# Patient Record
Sex: Female | Born: 1970
Health system: Southern US, Community
[De-identification: ages and names within clinical notes are randomized; demographics above are authoritative.]

## PROBLEM LIST (undated history)

## (undated) DIAGNOSIS — R918 Other nonspecific abnormal finding of lung field: Secondary | ICD-10-CM

## (undated) DIAGNOSIS — F329 Major depressive disorder, single episode, unspecified: Secondary | ICD-10-CM

## (undated) DIAGNOSIS — G2581 Restless legs syndrome: Secondary | ICD-10-CM

## (undated) DIAGNOSIS — T7840XA Allergy, unspecified, initial encounter: Secondary | ICD-10-CM

## (undated) DIAGNOSIS — N939 Abnormal uterine and vaginal bleeding, unspecified: Secondary | ICD-10-CM

## (undated) DIAGNOSIS — M1990 Unspecified osteoarthritis, unspecified site: Secondary | ICD-10-CM

## (undated) DIAGNOSIS — F909 Attention-deficit hyperactivity disorder, unspecified type: Secondary | ICD-10-CM

## (undated) DIAGNOSIS — Z9289 Personal history of other medical treatment: Secondary | ICD-10-CM

## (undated) DIAGNOSIS — I341 Nonrheumatic mitral (valve) prolapse: Secondary | ICD-10-CM

## (undated) DIAGNOSIS — G40209 Localization-related (focal) (partial) symptomatic epilepsy and epileptic syndromes with complex partial seizures, not intractable, without status epilepticus: Secondary | ICD-10-CM

## (undated) DIAGNOSIS — F419 Anxiety disorder, unspecified: Secondary | ICD-10-CM

## (undated) DIAGNOSIS — R002 Palpitations: Secondary | ICD-10-CM

## (undated) HISTORY — DX: Restless legs syndrome: G25.81

## (undated) HISTORY — DX: Anxiety disorder, unspecified: F41.9

## (undated) HISTORY — PX: NASAL SEPTUM SURGERY: SHX37

## (undated) HISTORY — DX: Attention-deficit hyperactivity disorder, unspecified type: F90.9

## (undated) HISTORY — DX: Other nonspecific abnormal finding of lung field: R91.8

## (undated) HISTORY — PX: TRANSTHORACIC ECHOCARDIOGRAM: SHX275

## (undated) HISTORY — PX: BREAST SURGERY: SHX581

## (undated) HISTORY — PX: PARTIAL MASTECTOMY WITH NEEDLE LOCALIZATION: SHX6008

## (undated) HISTORY — DX: Unspecified osteoarthritis, unspecified site: M19.90

## (undated) HISTORY — DX: Allergy, unspecified, initial encounter: T78.40XA

## (undated) HISTORY — DX: Nonrheumatic mitral (valve) prolapse: I34.1

---

## 2007-03-18 ENCOUNTER — Encounter (INDEPENDENT_AMBULATORY_CARE_PROVIDER_SITE_OTHER): Payer: Self-pay | Admitting: Diagnostic Radiology

## 2007-03-18 ENCOUNTER — Encounter: Admission: RE | Admit: 2007-03-18 | Discharge: 2007-03-18 | Payer: Self-pay | Admitting: Internal Medicine

## 2007-03-18 ENCOUNTER — Other Ambulatory Visit: Admission: RE | Admit: 2007-03-18 | Discharge: 2007-03-18 | Payer: Self-pay | Admitting: Diagnostic Radiology

## 2007-06-19 ENCOUNTER — Encounter: Admission: RE | Admit: 2007-06-19 | Discharge: 2007-06-19 | Payer: Self-pay | Admitting: Internal Medicine

## 2007-06-21 ENCOUNTER — Encounter (INDEPENDENT_AMBULATORY_CARE_PROVIDER_SITE_OTHER): Payer: Self-pay | Admitting: Diagnostic Radiology

## 2007-06-21 ENCOUNTER — Encounter: Admission: RE | Admit: 2007-06-21 | Discharge: 2007-06-21 | Payer: Self-pay | Admitting: Internal Medicine

## 2007-07-23 ENCOUNTER — Encounter: Admission: RE | Admit: 2007-07-23 | Discharge: 2007-07-23 | Payer: Self-pay | Admitting: General Surgery

## 2007-07-23 ENCOUNTER — Ambulatory Visit (HOSPITAL_BASED_OUTPATIENT_CLINIC_OR_DEPARTMENT_OTHER): Admission: RE | Admit: 2007-07-23 | Discharge: 2007-07-23 | Payer: Self-pay | Admitting: General Surgery

## 2007-07-23 ENCOUNTER — Encounter (INDEPENDENT_AMBULATORY_CARE_PROVIDER_SITE_OTHER): Payer: Self-pay | Admitting: General Surgery

## 2008-05-13 ENCOUNTER — Ambulatory Visit (HOSPITAL_COMMUNITY): Admission: RE | Admit: 2008-05-13 | Discharge: 2008-05-13 | Payer: Self-pay | Admitting: Internal Medicine

## 2008-10-05 ENCOUNTER — Other Ambulatory Visit: Admission: RE | Admit: 2008-10-05 | Discharge: 2008-10-05 | Payer: Self-pay | Admitting: Obstetrics and Gynecology

## 2008-11-12 ENCOUNTER — Encounter: Admission: RE | Admit: 2008-11-12 | Discharge: 2008-11-12 | Payer: Self-pay | Admitting: Family Medicine

## 2009-04-26 ENCOUNTER — Ambulatory Visit: Payer: Self-pay | Admitting: Psychology

## 2010-06-19 ENCOUNTER — Encounter: Payer: Self-pay | Admitting: Internal Medicine

## 2010-07-25 ENCOUNTER — Other Ambulatory Visit: Payer: Self-pay | Admitting: Obstetrics and Gynecology

## 2010-07-25 ENCOUNTER — Other Ambulatory Visit (HOSPITAL_COMMUNITY)
Admission: RE | Admit: 2010-07-25 | Discharge: 2010-07-25 | Disposition: A | Discharge status: Home / Self Care | Payer: BC Managed Care – HMO | Source: Ambulatory Visit | Attending: Obstetrics and Gynecology | Admitting: Obstetrics and Gynecology

## 2010-07-25 DIAGNOSIS — Z01419 Encounter for gynecological examination (general) (routine) without abnormal findings: Secondary | ICD-10-CM | POA: Insufficient documentation

## 2010-07-25 DIAGNOSIS — Z113 Encounter for screening for infections with a predominantly sexual mode of transmission: Secondary | ICD-10-CM | POA: Insufficient documentation

## 2010-10-11 NOTE — Op Note (Signed)
[name], Krista Meyer          ACCOUNT NO.:  [id number]   MEDICAL RECORD NO.:  [id number]          PATIENT TYPE:  AMB   LOCATION:  DSC                          FACILITY:  [facility]   PHYSICIAN:  Adolph Pollack, M.D.DATE OF BIRTH:  1970-11-23   DATE OF PROCEDURE:  07/23/2007  DATE OF DISCHARGE:                               OPERATIVE REPORT   PREOPERATIVE DIAGNOSIS:  Abnormal right mammogram with abnormal right  breast lesions.   POSTOPERATIVE DIAGNOSIS:  Abnormal right mammogram with abnormal right  breast lesions.   PROCEDURE:  Right partial mastectomy after needle localizations.   SURGEON:  Adolph Pollack, M.D.   ANESTHESIA:  General plus Marcaine local.   INDICATIONS:  This 40 year old female had abnormal right breast  mammogram and underwent a biopsy of a 12 o'clock and a 2 o'clock lesion.  They both had some stromal fibrosis and microcalcifications; however,  there is some discordance between the mammographic findings and  pathology and now she presents for needle localizations and biopsies of  the area.   TECHNIQUE:  She underwent successful needle localization by Dr. Anselmo Pickler at the breast center.  She was then brought to [facility] day surgery,  right breast marked with my initials.  She is then placed supine on the  operating table and general anesthetic was administered.  The bandages  from the right breast removed and the wires were cut.  The right breast  and wires were sterilely prepped and draped.  From about the 11 o'clock  and 2 o'clock position, a curvilinear incision was made in the right  breast in the skin and subcutaneous tissue.  I elevated the superior  flap and pulled the wires through the flap.  I then basically removed  most of the tissue of the right upper outer quadrant beginning  3-4 cm  away from the nipple areolar complex.  Once I had done this then I  placed it in the Faxitron.  She had clips in from previous biopsy sites  and this  showed the clips were centralized.  I sent this over to Dr.  Jean Rosenthal, who concurred that we had the area of concern.  The specimen  was marked with one suture medial, two sutures superior and then sent to  the pathology lab.   Following this I controlled bleeding with electrocautery.  Once  hemostasis was adequate, I anesthetized the superficial and deep  portions of the wound with Marcaine solution.  I then closed the  subcutaneous tissue with interrupted 3-0 Vicryl sutures.  The skin  incision was closed with a 4-0 Monocryl subcuticular stitch.  Steri-  Strips and sterile dressing were applied.   She tolerated the procedure well without apparent complications and was  taken to recovery room in satisfactory condition.  She will be given  discharge instructions and Vicodin for pain and follow up in the office  in one to two weeks.      Adolph Pollack, M.D.  Electronically Signed     TJR/MEDQ  D:  07/23/2007  T:  07/24/2007  Job:  [id number]   cc:   Feliciana Rossetti, MD  Nolon Nations, M.D.

## 2011-02-17 LAB — DIFFERENTIAL
Basophils Absolute: 0
Lymphocytes Relative: 33
Lymphs Abs: 2
Monocytes Absolute: 0.5
Monocytes Relative: 8
Neutro Abs: 3.4

## 2011-02-17 LAB — BASIC METABOLIC PANEL
CO2: 28
Calcium: 8.8
Chloride: 100
Creatinine, Ser: 0.71
GFR calc non Af Amer: >60
Potassium: 3.7

## 2011-02-17 LAB — CBC: WBC: 6.1

## 2011-02-17 LAB — POCT HEMOGLOBIN-HEMACUE: Hemoglobin: 13.8

## 2012-12-24 ENCOUNTER — Encounter: Payer: Self-pay | Admitting: Neurology

## 2012-12-24 ENCOUNTER — Ambulatory Visit (INDEPENDENT_AMBULATORY_CARE_PROVIDER_SITE_OTHER): Payer: 59 | Admitting: Neurology

## 2012-12-24 VITALS — BP 90/64 | HR 78 | Temp 98.0°F | Ht 63.0 in | Wt 140.0 lb

## 2012-12-24 DIAGNOSIS — G2581 Restless legs syndrome: Secondary | ICD-10-CM

## 2012-12-24 DIAGNOSIS — G40109 Localization-related (focal) (partial) symptomatic epilepsy and epileptic syndromes with simple partial seizures, not intractable, without status epilepticus: Secondary | ICD-10-CM

## 2012-12-24 DIAGNOSIS — R569 Unspecified convulsions: Secondary | ICD-10-CM

## 2012-12-24 LAB — FERRITIN: Ferritin: 86.6 ng/mL (ref 10.0–291.0)

## 2012-12-24 LAB — VITAMIN B12: Vitamin B-12: 161 pg/mL — ABNORMAL LOW (ref 211–911)

## 2012-12-24 MED ORDER — LEVETIRACETAM ER 500 MG PO TB24: 500.0000 mg | ORAL_TABLET | Freq: Two times a day (BID) | ORAL | Status: DC

## 2012-12-24 MED ORDER — ROPINIROLE HCL 0.5 MG PO TABS: ORAL_TABLET | ORAL | Status: DC

## 2012-12-24 NOTE — Patient Instructions (Addendum)
1.  Continue Keppra XR 500mg  twice daily. 2.  We will start ropinirole 0.5mg  tablets.  Take 1/2 tab at bedtime x 2 days,   Then 1 tab at bedtime x 5 days,  Then 2 tabs at bedtime.  Take about 1 hour before bed.  Side effects include sleepiness, dizziness, vivid dreams, compulsive behavior. 3.  Check ferritin level. 4.  Follow up in 6 months.  Call with questions or concerns.

## 2012-12-24 NOTE — Progress Notes (Signed)
NEUROLOGY CONSULTATION NOTE  [name] MRN: [id number] DOB: 1970-06-14   Referring provider: Dr. Adella Hare Primary care provider: Dr. Barbaraann Barthel  Reason for consult:  Establish care  HISTORY OF PRESENT ILLNESS: Krista Meyer is a 42 y.o. female with ADHD and seizure disorder who presents to establish care regarding history of seizure disorder.  Outside records and reports reviewed.  She began experiencing symptoms in highschool, described as a feeling of "dj vu" and a "weird "feeling". It was really brief and she was told that her face turns red. There was no altered consciousness or alertness. At that time, she had a cardiac workup which was negative. Her symptoms were thought to be psychosomatic. A couple of years later, while at college, she had one of her dj vu feelings followed by a generalized tonic-clonic seizure. This was preceded by a long night awake and feeling exhausted.  She was initially started on Dilantin, but she became toxic. She was subsequently switched to Tegretol. It is been effective, but it gave her a headache. About 3 or 4 years ago, she was switched to Keppra and has been tolerating it very well. She has not had any further generalized tonic-clonic seizures or typical dj vu spells since college. Occasionally, she has a very brief indescribable dj vu feeling when she is in bed while falling asleep.  More recently, she has been experiencing restless leg symptoms.  She describes a "creepy crawly" feeling associated with cramping involving both the arms and the legs.  Moving or applying warm pressure relieves the symptoms.  It only bothers her in the evening, while in bed or just prior to going to bed.  She does have a history of arthritic back pain.  MRI of lumbar spine performed 08/31/03 revealed degenerative disk disease at L4-L5, with prominent disk bulge and posterior annular tear, but no disk extrusion or stenosis.  She had a sleep study performed  performed on 10/03/11 to assess for sleep apnea.  Although she did not have apnea, the technician told her she would move her legs about every 3 minutes.  She has had routine EEGs which were both normal and sometimes abnormal.  She was told abnormal EEGs revealed discharges in the right temporal lobe.  MRI of brain without contrast, performed on 01/16/08, was unremarkable.    No family history of seizures.  PAST MEDICAL HISTORY: Past Medical History  Diagnosis Date  . Seizure disorder     dx at age 73    PAST SURGICAL HISTORY: Past Surgical History  Procedure Laterality Date  . Nasal septum surgery      MEDICATIONS: No current outpatient prescriptions on file prior to visit.   No current facility-administered medications on file prior to visit.    ALLERGIES: Allergies  Allergen Reactions  . Iodine     "Burns my skin"  . Shellfish Allergy     FAMILY HISTORY: No family history on file.  SOCIAL HISTORY: History   Social History  . Marital Status: Married    Spouse Name: N/A    Number of Children: N/A  . Years of Education: N/A   Occupational History  . Not on file.   Social History Main Topics  . Smoking status: Never Smoker   . Smokeless tobacco: Never Used  . Alcohol Use: No  . Drug Use: No  . Sexually Active: Not on file   Other Topics Concern  . Not on file   Social History Narrative  . No narrative on file  REVIEW OF SYSTEMS: Constitutional: No fevers, chills, or sweats, no generalized fatigue, change in appetite Eyes: No visual changes, double vision, eye pain Ear, nose and throat: No hearing loss, ear pain, nasal congestion, sore throat Cardiovascular: No chest pain, palpitations Respiratory:  No shortness of breath at rest or with exertion, wheezes GastrointestinaI: No nausea, vomiting, diarrhea, abdominal pain, fecal incontinence Genitourinary:  No dysuria, urinary retention or frequency Musculoskeletal:  No neck pain, back  pain Integumentary: No rash, pruritus, skin lesions Neurological: as above Psychiatric: No depression, insomnia, anxiety Endocrine: No palpitations, fatigue, diaphoresis, mood swings, change in appetite, change in weight, increased thirst Hematologic/Lymphatic:  No anemia, purpura, petechiae. Allergic/Immunologic: no itchy/runny eyes, nasal congestion, recent allergic reactions, rashes  PHYSICAL EXAM: Filed Vitals:   12/24/12 0829  BP: 90/64  Pulse: 78  Temp: 98 F (36.7 C)   General: No acute distress Head:  Normocephalic/atraumatic Neck: supple, no paraspinal tenderness, full range of motion Back: No paraspinal tenderness Heart: regular rate and rhythm Lungs: Clear to auscultation bilaterally. Vascular: No carotid bruits. Neurological Exam: Mental status: alert and oriented to person, place, time and self, speech fluent and not dysarthric, language intact. Cranial nerves: CN I: not tested CN II: pupils equal, round and reactive to light, visual fields intact, fundi unremarkable. CN III, IV, VI:  full range of motion, no nystagmus, no ptosis CN V: facial sensation intact CN VII: upper and lower face symmetric CN VIII: hearing intact CN IX, X: gag intact, uvula midline CN XI: sternocleidomastoid and trapezius muscles intact CN XII: tongue midline Bulk & Tone: normal, no fasciculations. Motor: 5/5 throughout Sensation: temperature and vibration intact Deep Tendon Reflexes: 2+ throughout, toes down Finger to nose testing: normal Heel to shin: normal Gait: normal, able to walk in tandem. Romberg negative.  IMPRESSION & PLAN: Krista Meyer is a 42 y.o. female with: 1.  Localization-related epilepsy, well-controlled.  - Continue Keppra XR 500mg  BID  - No driving restrictions warranted.  - She asked if Keppra was a good choice in case she wishes to get pregnant.  I told her that Keppra is one of the ideal antiepileptic medications to take while pregnant, as it has a  low teratogenic profile.  Defects, if any, are very mild, such as cleft lip.  2.  Restless leg syndrome.  - Start ropinirole 0.25mg  and titrate up to 1mg  qhs over a week.  Take about 1 hour prior to bed.  Side effects, such as somnolence and compulsive behavior discussed.  - Check ferritin and B12 levels  3.  Follow up in 6 months or as needed.  Call with questions or concerns.  Thank you for allowing me to take part in the care of this patient.  Shon Millet, DO  CC:  Clayborn Heron, MD  Veverly Fells. Adella Hare, MD

## 2013-02-04 ENCOUNTER — Other Ambulatory Visit: Payer: Self-pay | Admitting: Obstetrics and Gynecology

## 2013-02-04 ENCOUNTER — Other Ambulatory Visit (HOSPITAL_COMMUNITY)
Admission: RE | Admit: 2013-02-04 | Discharge: 2013-02-04 | Disposition: A | Discharge status: Home / Self Care | Payer: 59 | Source: Ambulatory Visit | Attending: Obstetrics and Gynecology | Admitting: Obstetrics and Gynecology

## 2013-02-04 DIAGNOSIS — Z01419 Encounter for gynecological examination (general) (routine) without abnormal findings: Secondary | ICD-10-CM | POA: Insufficient documentation

## 2013-02-04 DIAGNOSIS — Z1151 Encounter for screening for human papillomavirus (HPV): Secondary | ICD-10-CM | POA: Insufficient documentation

## 2013-02-21 ENCOUNTER — Other Ambulatory Visit: Payer: Self-pay | Admitting: Neurology

## 2013-02-21 MED ORDER — LEVETIRACETAM ER 500 MG PO TB24: 500.0000 mg | ORAL_TABLET | Freq: Two times a day (BID) | ORAL | Status: DC

## 2013-02-24 ENCOUNTER — Other Ambulatory Visit: Payer: Self-pay | Admitting: Neurology

## 2013-02-24 MED ORDER — ROPINIROLE HCL 0.5 MG PO TABS: ORAL_TABLET | ORAL | Status: DC

## 2013-02-28 ENCOUNTER — Telehealth: Payer: Self-pay | Admitting: Neurology

## 2013-02-28 NOTE — Telephone Encounter (Signed)
Left the patient a message as per Dr. Everlena Cooper below. Asked that she call the office on Monday morning if she would like to try a different medication. Will wait to hear from her.

## 2013-02-28 NOTE — Telephone Encounter (Signed)
Patient left a vm message on my machine at 0906 am stating the Ropinirole that Dr. Everlena Cooper stated her on was just not working and the side effects were worse than the restless legs. She wants to know how to stop the medication...wean down or stop cold [place]. She is asking for a call back with instructions on how to stop. **Dr. Everlena Cooper please advise.

## 2013-02-28 NOTE — Telephone Encounter (Signed)
I assume she is taking 1mg  at bedtime.  She can take 0.5mg  at bedtime for 3 days and then stop.  Instead, we can try an alternative medication, such as Mirapex 0.125mg  at bedtime.  It is the same class of drug with similar side effects, but maybe she would tolerate it better.

## 2013-03-06 NOTE — Telephone Encounter (Signed)
No return call from the patient 

## 2013-05-16 ENCOUNTER — Other Ambulatory Visit: Payer: Self-pay | Admitting: Neurology

## 2013-05-16 MED ORDER — LEVETIRACETAM ER 500 MG PO TB24: 500.0000 mg | ORAL_TABLET | Freq: Two times a day (BID) | ORAL | Status: DC

## 2013-06-16 ENCOUNTER — Telehealth: Payer: Self-pay | Admitting: Neurology

## 2013-06-16 MED ORDER — LEVETIRACETAM ER 500 MG PO TB24: 500.0000 mg | ORAL_TABLET | Freq: Two times a day (BID) | ORAL | Status: DC

## 2013-06-16 NOTE — Telephone Encounter (Signed)
Levetiracetam ER 500 mg #60 refill request received from [facility]. Approved with 0 refills. Note sent to pharmacy for patient to make follow up appt for additional refills.

## 2013-07-16 ENCOUNTER — Telehealth: Payer: Self-pay | Admitting: *Deleted

## 2013-07-16 NOTE — Telephone Encounter (Signed)
Pt has scheduled a follow up scheduled for 07/28/13. She will need med refill (see prev. Documentation) to last her until that appt. Please call patient / Krista Meyer.

## 2013-07-16 NOTE — Telephone Encounter (Signed)
Patient is aware that an appt is needed before meds can be refilled

## 2013-07-23 ENCOUNTER — Telehealth: Payer: Self-pay | Admitting: Neurology

## 2013-07-23 NOTE — Telephone Encounter (Signed)
Per patient she is low on her medication and does not have enough until Monday when she comes in to see D r Tomi Likens please call her at [phone]  Medication keppra

## 2013-07-28 ENCOUNTER — Encounter: Payer: Self-pay | Admitting: Neurology

## 2013-07-28 ENCOUNTER — Ambulatory Visit (INDEPENDENT_AMBULATORY_CARE_PROVIDER_SITE_OTHER): Payer: 59 | Admitting: Neurology

## 2013-07-28 VITALS — BP 118/60 | HR 68 | Temp 97.6°F | Resp 18 | Ht 63.0 in | Wt 138.2 lb

## 2013-07-28 DIAGNOSIS — G2581 Restless legs syndrome: Secondary | ICD-10-CM

## 2013-07-28 DIAGNOSIS — G40109 Localization-related (focal) (partial) symptomatic epilepsy and epileptic syndromes with simple partial seizures, not intractable, without status epilepticus: Secondary | ICD-10-CM

## 2013-07-28 MED ORDER — LEVETIRACETAM ER 500 MG PO TB24: 500.0000 mg | ORAL_TABLET | Freq: Two times a day (BID) | ORAL | Status: DC

## 2013-07-28 NOTE — Progress Notes (Signed)
NEUROLOGY FOLLOW UP OFFICE NOTE  [name] [id number]  HISTORY OF PRESENT ILLNESS: Krista Meyer is a 43 y.o. female with ADHD and seizure disorder who follows up for localization-related seizure disorder and restless leg syndrome.  Records and reports reviewed.  Records and images were personally reviewed where available.    UPDATE: She has had no seizures.  She feels a little moody, but not sure if it is due to the [place] or just how she feels (she has a history of irritability and moodiness).  She was started on Prozac last fall, and that helps.  She was taking turmeric for her skin.  When she stopped taking it, her restless leg resolved.  She is no longer on a dopamine agonist.  HISTORY: Seizures:  She began experiencing symptoms in highschool, described as a feeling of "dj vu" and a "weird "feeling". It was really brief and she was told that her face turns red. There was no altered consciousness or alertness. At that time, she had a cardiac workup which was negative. Her symptoms were thought to be psychosomatic. A couple of years later, while at college, she had one of her dj vu feelings followed by a generalized tonic-clonic seizure. This was preceded by a long night awake and feeling exhausted.  She was initially started on Dilantin, but she became toxic. She was subsequently switched to Tegretol. It is been effective, but it gave her a headache. About 3 or 4 years ago, she was switched to [place] and has been tolerating it very well. She has not had any further generalized tonic-clonic seizures or typical dj vu spells since college. Occasionally, she has a very brief indescribable dj vu feeling when she is in bed while falling asleep.  She has had routine EEGs which were both normal and sometimes abnormal.  She was told abnormal EEGs revealed discharges in the right temporal lobe.  MRI of brain without contrast, performed on 01/16/08, was unremarkable.  No family  history of seizures.  She takes Keppra XR 500mg  twice daily.  Prior medications include Dilantin (toxic), Tegretol (headache), phenobarb (side effect), Lamictal (not remember why it was stopped).  Restless leg syndrome:  She describes a "creepy crawly" feeling associated with cramping involving both the arms and the legs.  Moving or applying warm pressure relieves the symptoms.  It only bothers her in the evening, while in bed or just prior to going to bed.  She does have a history of arthritic back pain.  MRI of lumbar spine performed 08/31/03 revealed degenerative disk disease at L4-L5, with prominent disk bulge and posterior annular tear, but no disk extrusion or stenosis.  She had a sleep study performed performed on 10/03/11 to assess for sleep apnea.  Although she did not have apnea, the technician told her she would move her legs about every 3 minutes.  Ropinirole caused unwanted side effects.  PAST MEDICAL HISTORY: Past Medical History  Diagnosis Date  . Seizure disorder     dx at age 50    MEDICATIONS: Current Outpatient Prescriptions on File Prior to Visit  Medication Sig Dispense Refill  . acetaminophen (TYLENOL) 325 MG tablet Take 325 mg by mouth every 6 (six) hours as needed for pain (prn).       No current facility-administered medications on file prior to visit.    ALLERGIES: Allergies  Allergen Reactions  . Iodine     "Burns my skin"  . Shellfish Allergy     FAMILY HISTORY:  Family History  Problem Relation Age of Onset  . Diabetes insipidus Paternal Uncle   . Stroke Paternal Grandfather   . Diabetes insipidus Maternal Aunt     SOCIAL HISTORY: History   Social History  . Marital Status: Married    Spouse Name: N/A    Number of Children: N/A  . Years of Education: N/A   Occupational History  . Not on file.   Social History Main Topics  . Smoking status: Never Smoker   . Smokeless tobacco: Never Used  . Alcohol Use: No  . Drug Use: No  . Sexual Activity:  Not on file   Other Topics Concern  . Not on file   Social History Narrative  . No narrative on file    REVIEW OF SYSTEMS: Constitutional: No fevers, chills, or sweats, no generalized fatigue, change in appetite Eyes: No visual changes, double vision, eye pain Ear, nose and throat: No hearing loss, ear pain, nasal congestion, sore throat Cardiovascular: No chest pain, palpitations Respiratory:  No shortness of breath at rest or with exertion, wheezes GastrointestinaI: No nausea, vomiting, diarrhea, abdominal pain, fecal incontinence Genitourinary:  No dysuria, urinary retention or frequency Musculoskeletal:  No neck pain, back pain Integumentary: No rash, pruritus, skin lesions Neurological: as above Psychiatric: No depression, insomnia, anxiety Endocrine: No palpitations, fatigue, diaphoresis, mood swings, change in appetite, change in weight, increased thirst Hematologic/Lymphatic:  No anemia, purpura, petechiae. Allergic/Immunologic: no itchy/runny eyes, nasal congestion, recent allergic reactions, rashes  PHYSICAL EXAM: Filed Vitals:   07/28/13 1426  BP: 118/60  Pulse: 68  Temp: 97.6 F (36.4 C)  Resp: 18   General: No acute distress Head:  Normocephalic/atraumatic Neck: supple, no paraspinal tenderness, full range of motion Heart:  Regular rate and rhythm Lungs:  Clear to auscultation bilaterally Back: No paraspinal tenderness Neurological Exam: alert and oriented to person, place, and time. Speech fluent and not dysarthric, language intact.  CN II-XII intact. Fundoscopic exam unremarkable, no papilledema.  Bulk and tone normal, muscle strength 5/5 throughout.  Sensation to light touch, temperature and vibration intact.  Deep tendon reflexes 2+ throughout, toes downgoing.  Finger to nose and heel to shin testing intact.  Gait normal, Romberg negative.  IMPRESSION: 1.  Localization-related epilepsy, controlled. 2.  Restless leg syndrome resolved.  Possibly related to  turmeric?  PLAN: 1.  She would like to continue Keppra XR 500mg  BID for now.  Refill placed.  Other options would be to switch to Trileptal. 2.  Follow up in 6 months or as needed.  Metta Clines, DO  CC:  Milagros Evener, MD

## 2013-07-28 NOTE — Patient Instructions (Signed)
Continue Keppra XR 500mg  twice daily.  Follow up in 6 months.

## 2013-08-13 ENCOUNTER — Telehealth: Payer: Self-pay | Admitting: *Deleted

## 2013-08-13 NOTE — Telephone Encounter (Signed)
Keppra  ER 500 mg 1 bid # 60 with 2 refills [facility]  [place]

## 2013-09-25 ENCOUNTER — Ambulatory Visit: Payer: 59 | Admitting: Physician Assistant

## 2014-01-31 ENCOUNTER — Other Ambulatory Visit: Payer: Self-pay | Admitting: Neurology

## 2014-02-03 NOTE — Telephone Encounter (Signed)
Keppra refill requested. Per last office note- patient to remain on medication. Refill approved and sent to patient's pharmacy.   

## 2014-02-09 ENCOUNTER — Other Ambulatory Visit: Payer: Self-pay | Admitting: Neurology

## 2014-05-03 ENCOUNTER — Other Ambulatory Visit: Payer: Self-pay | Admitting: Neurology

## 2015-02-02 ENCOUNTER — Other Ambulatory Visit: Payer: Self-pay | Admitting: Neurology

## 2015-02-02 NOTE — Telephone Encounter (Signed)
Rx sent 

## 2015-07-06 ENCOUNTER — Other Ambulatory Visit: Payer: Self-pay | Admitting: Neurology

## 2015-07-06 NOTE — Telephone Encounter (Signed)
Last OV: 07/28/13 Next OV: 0/0/0 LAST REFILL AS I SEE NO PRIOR WARNING TO SCHEDULE

## 2016-01-05 ENCOUNTER — Encounter: Payer: Self-pay | Admitting: Neurology

## 2016-01-05 ENCOUNTER — Ambulatory Visit (INDEPENDENT_AMBULATORY_CARE_PROVIDER_SITE_OTHER): Payer: 59 | Admitting: Neurology

## 2016-01-05 VITALS — BP 106/66 | HR 87 | Ht 63.0 in | Wt 143.0 lb

## 2016-01-05 DIAGNOSIS — G40209 Localization-related (focal) (partial) symptomatic epilepsy and epileptic syndromes with complex partial seizures, not intractable, without status epilepticus: Secondary | ICD-10-CM | POA: Diagnosis not present

## 2016-01-05 MED ORDER — LEVETIRACETAM ER 500 MG PO TB24: 500.0000 mg | ORAL_TABLET | Freq: Two times a day (BID) | ORAL | 3 refills | Status: DC

## 2016-01-05 NOTE — Patient Instructions (Signed)
Continue the Keppra XR 500mg  twice daily.  Make sure to take it same time daily, even during weekends. Follow up in one year.

## 2016-01-05 NOTE — Progress Notes (Signed)
Chart forwarded.  

## 2016-01-05 NOTE — Progress Notes (Signed)
NEUROLOGY FOLLOW UP OFFICE NOTE  OLUWATOMISIN TIMBLIN [id number]  HISTORY OF PRESENT ILLNESS: Krista Meyer is a 45 y.o. female with ADHD, restless leg syndrome and seizure disorder who follows up for localization-related seizure disorder    UPDATE: She has not been seen since March 2015.  She takes Keppra XR 500mg  twice daily.  She has been doing well.  On one occasion, about one month ago, she woke up and noted that she had bitten her tongue.  A seizure during sleep was not witnessed.  She thinks she may have forgot to take her Keppra.Marland Kitchen   HISTORY: Seizures:  She began experiencing symptoms in highschool, described as a feeling of "dj vu" and a "weird "feeling". It was really brief and she was told that her face turns red. There was no altered consciousness or alertness. At that time, she had a cardiac workup which was negative. Her symptoms were thought to be psychosomatic. A couple of years later, while at college, she had one of her dj vu feelings followed by a generalized tonic-clonic seizure. This was preceded by a long night awake and feeling exhausted.  She was initially started on Dilantin, but she became toxic. She was subsequently switched to Tegretol. It is been effective, but it gave her a headache. About 3 or 4 years ago, she was switched to [place] and has been tolerating it very well. She has not had any further generalized tonic-clonic seizures or typical dj vu spells since college. Occasionally, she has a very brief indescribable dj vu feeling when she is in bed while falling asleep.  She has had routine EEGs which were both normal and sometimes abnormal.  She was told abnormal EEGs revealed discharges in the right temporal lobe.  MRI of brain without contrast, performed on 01/16/08, was unremarkable.  No family history of seizures.   She takes Keppra XR 500mg  twice daily.  Prior medications include Dilantin (toxic), Tegretol (headache), phenobarb (side effect),  Lamictal (not remember why it was stopped).  PAST MEDICAL HISTORY: Past Medical History:  Diagnosis Date  . Seizure disorder ([place])    dx at age 76    MEDICATIONS: Current Outpatient Prescriptions on File Prior to Visit  Medication Sig Dispense Refill  . acetaminophen (TYLENOL) 325 MG tablet Take 325 mg by mouth every 6 (six) hours as needed for pain (prn).    Marland Kitchen FLUoxetine (PROZAC) 20 MG capsule Take 20 mg by mouth daily.     No current facility-administered medications on file prior to visit.     ALLERGIES: Allergies  Allergen Reactions  . Iodine     "Burns my skin"  . Shellfish Allergy     FAMILY HISTORY: Family History  Problem Relation Age of Onset  . Diabetes insipidus Paternal Uncle   . Stroke Paternal Grandfather   . Diabetes insipidus Maternal Aunt     SOCIAL HISTORY: Social History   Social History  . Marital status: Married    Spouse name: N/A  . Number of children: N/A  . Years of education: N/A   Occupational History  . Not on file.   Social History Main Topics  . Smoking status: Never Smoker  . Smokeless tobacco: Never Used  . Alcohol use No  . Drug use: No  . Sexual activity: Not on file   Other Topics Concern  . Not on file   Social History Narrative  . No narrative on file    REVIEW OF SYSTEMS: Constitutional: No fevers, chills,  or sweats, no generalized fatigue, change in appetite Eyes: No visual changes, double vision, eye pain Ear, nose and throat: No hearing loss, ear pain, nasal congestion, sore throat Cardiovascular: No chest pain, palpitations Respiratory:  No shortness of breath at rest or with exertion, wheezes GastrointestinaI: No nausea, vomiting, diarrhea, abdominal pain, fecal incontinence Genitourinary:  No dysuria, urinary retention or frequency Musculoskeletal:  No neck pain, back pain Integumentary: No rash, pruritus, skin lesions Neurological: as above Psychiatric: No depression, insomnia, anxiety Endocrine: No  palpitations, fatigue, diaphoresis, mood swings, change in appetite, change in weight, increased thirst Hematologic/Lymphatic:  No purpura, petechiae. Allergic/Immunologic: no itchy/runny eyes, nasal congestion, recent allergic reactions, rashes  PHYSICAL EXAM: Vitals:   01/05/16 1345  BP: 106/66  Pulse: 87   General: No acute distress.  Patient appears well-groomed.  normal body habitus. Head:  Normocephalic/atraumatic Eyes:  Fundi examined but not visualized Neck: supple, no paraspinal tenderness, full range of motion Heart:  Regular rate and rhythm Lungs:  Clear to auscultation bilaterally Back: No paraspinal tenderness Neurological Exam: alert and oriented to person, place, and time. Attention span and concentration intact, recent and remote memory intact, fund of knowledge intact.  Speech fluent and not dysarthric, language intact.  CN II-XII intact. Bulk and tone normal, muscle strength 5/5 throughout.  Sensation to light touch, temperature and vibration intact.  Deep tendon reflexes 2+ throughout.  Finger to nose and heel to shin testing intact.  Gait normal, Romberg negative.  IMPRESSION: Localization-related epilepsy, stable  PLAN: Continue Keppra XR 500mg  twice daily.  Take as directed and be aware of compliance Follow up in one year  15 minutes spent face to face with patient, over 50% spent counseling.  Metta Clines, DO  CC:  Milagros Evener, MD

## 2016-07-14 ENCOUNTER — Encounter: Payer: Self-pay | Admitting: Physician Assistant

## 2016-07-14 NOTE — Progress Notes (Signed)
Cardiology Office Note    Date:  07/18/2016  ID:  Krista Meyer, DOB March 11, 1971, MRN [id number] PCP:  [name], MD  Cardiologist:  New, reviewed with Dr. Curt Bears   Chief Complaint: chest pain, palpitations  History of Present Illness:  Krista Meyer is a 46 y.o. female with history of ADHD, RLS, seizure disorder, depression, iiodine allergy who presents for evaluation of chest pain and irregular heartbeat. Regarding these two issues:  1) Chest pain - intermittent since 2010. She first developed this while hiking associated with jaw tightness and nausea - this launched a cardiac workup with ETT 2010: nondiagnostic given baseline ECG abnormality, no symptoms, doubt ischemia. 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 She also noticed around the same time that a prior lumbar back pain began to shift upwards into her thorax with pain in her back radiating through to her chest. She had an MRI to evaluate for MS because she was feeling what she called the "MS hug." At one point she would notice that swimming with forward movement of the arms would exacerbate the discomfort. However, in more recent years she's been able to participate in exercise without any exertional exacerbation of discomfort. She does feel winded with harder exertion. She admit she's afraid to really push herself hard because of fear that her prior chest pain could be cardiac. She was previously sent to PT for what was called costochondritis. She has noticed an association of worsening pain during a period of depression last year. She also has noticed intermittent refluxing of gastric contents into her throat. This is worse if she lays down to sleep soon after eating dinner. She has been taking omeprazole for 2 days and has not noticed a difference yet. Her chest pain waxes and wanes and can last for weeks at a time during flare ups. No fam hx of CAD. Lifelong nonsmoker. No h/o DM, HLD, HTN.  2) Palpitations - worse  with menstruation, occur nightly when she goes down to sleep, described as her heart speeding up. No recent syncope. The chest pain/dyspnea is not acutely associated with this.  Past Medical History:  Diagnosis Date  . ADHD   . Restless leg syndrome   . Seizure disorder ([place])    dx at age 49    Past Surgical History:  Procedure Laterality Date  . NASAL SEPTUM SURGERY      Current Medications: Current Outpatient Prescriptions  Medication Sig Dispense Refill  . acetaminophen (TYLENOL) 325 MG tablet Take 325 mg by mouth every 6 (six) hours as needed for pain (prn).    Marland Kitchen FLUoxetine (PROZAC) 20 MG capsule Take 20 mg by mouth daily.    Marland Kitchen levETIRAcetam (KEPPRA XR) 500 MG 24 hr tablet Take 1 tablet (500 mg total) by mouth 2 (two) times daily. 180 tablet 3  . Omeprazole (PRILOSEC PO) Take 1 tablet by mouth daily.     No current facility-administered medications for this visit.      Allergies:   Iodine and Shellfish allergy   Social History   Social History  . Marital status: Married    Spouse name: N/A  . Number of children: N/A  . Years of education: N/A   Social History Main Topics  . Smoking status: Never Smoker  . Smokeless tobacco: Never Used  . Alcohol use No  . Drug use: No  . Sexual activity: Not Asked   Other Topics Concern  . None   Social History Narrative  .  None     Family History:  The patient's family history includes Diabetes insipidus in her maternal aunt and paternal uncle; Stroke in her paternal grandfather.   ROS:   Please see the history of present illness. + cyclic breast pain which she states has been evaluated. No BRBPR, melena, hematemesis. All other systems are reviewed and otherwise negative.    PHYSICAL EXAM:   VS:  BP 122/68   Pulse (!) 57   Ht 5\' 3"  (1.6 m)   Wt 149 lb 12.8 oz (67.9 kg)   BMI 26.54 kg/m   BMI: Body mass index is 26.54 kg/m. GEN: Well nourished, well developed WF, in no acute distress  HEENT: normocephalic,  atraumatic Neck: no JVD, carotid bruits, or masses Cardiac: RRR; no murmurs, rubs, or gallops, no edema  Respiratory:  clear to auscultation bilaterally, normal work of breathing GI: soft, nontender, nondistended, + BS MS: no deformity or atrophy  Skin: warm and dry, no rash Neuro:  Alert and Oriented x 3, Strength and sensation are intact, follows commands Psych: euthymic mood, full affect  Wt Readings from Last 3 Encounters:  07/18/16 149 lb 12.8 oz (67.9 kg)  01/05/16 143 lb (64.9 kg)  07/28/13 138 lb 3.2 oz (62.7 kg)      Studies/Labs Reviewed:   EKG:  EKG was ordered today and personally reviewed by me and demonstrates sinus bradycardia 56bpm, nonspeific T wave changes with TWI III, QTc 484ms  Recent Labs: No results found for requested labs within last 8760 hours.   Lipid Panel No results found for: CHOL, TRIG, HDL, CHOLHDL, VLDL, LDLCALC, LDLDIRECT  Additional studies/ records that were reviewed today include: Summarized above.    ASSESSMENT & PLAN:   The patient case was reviewed with Dr. Curt Bears since this is a new patient to our clinic.  1. Chest pain - Her initial presentation in 2010 sounded concerning, but she's had mostly atypical symptoms since that time. Per review with MD, will proceed with calcium score. If this is low risk, no further ischemic workup is indicated. If this detects significant presence of coronary calcium, will need further risk stratification with stress testing. Continue omeprazole. Will also check CBC to ensure she's not experiencing significant reflux contributing to GIB. 2. Palpitations - these have been occurring nightly, worse under hormonal influence. Will obtain 48 hour Holter to further assess. Check basic labs including lytes, thyroid, CBC.  3. 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 This was only mild on exam in 2010. Will follow clinically for now.  4. H/o seizure disorder - followed by neurology. I do not see any medications  on board that can contribute to significant palpitations at this time.  Disposition: F/u with me in 2 weeks post-testing.   Medication Adjustments/Labs and Tests Ordered: Current medicines are reviewed at length with the patient today.  Concerns regarding medicines are outlined above. Medication changes, Labs and Tests ordered today are summarized above and listed in the Patient Instructions accessible in Encounters.   Raechel Ache PA-C  07/18/2016 8:50 AM    [address] [place], [place], [place]  [address] Phone: [phone]; Fax: [phone]

## 2016-07-17 ENCOUNTER — Encounter: Payer: Self-pay | Admitting: Physician Assistant

## 2016-07-18 ENCOUNTER — Encounter: Payer: Self-pay | Admitting: Physician Assistant

## 2016-07-18 ENCOUNTER — Ambulatory Visit (INDEPENDENT_AMBULATORY_CARE_PROVIDER_SITE_OTHER): Payer: 59 | Admitting: Physician Assistant

## 2016-07-18 ENCOUNTER — Encounter (INDEPENDENT_AMBULATORY_CARE_PROVIDER_SITE_OTHER): Payer: Self-pay

## 2016-07-18 VITALS — BP 122/68 | HR 57 | Ht 63.0 in | Wt 149.8 lb

## 2016-07-18 DIAGNOSIS — R002 Palpitations: Secondary | ICD-10-CM

## 2016-07-18 DIAGNOSIS — I341 Nonrheumatic mitral (valve) prolapse: Secondary | ICD-10-CM

## 2016-07-18 DIAGNOSIS — I340 Nonrheumatic mitral (valve) insufficiency: Secondary | ICD-10-CM

## 2016-07-18 DIAGNOSIS — R0789 Other chest pain: Secondary | ICD-10-CM | POA: Diagnosis not present

## 2016-07-18 DIAGNOSIS — Z8669 Personal history of other diseases of the nervous system and sense organs: Secondary | ICD-10-CM

## 2016-07-18 LAB — CBC
HEMOGLOBIN: 12.7 g/dL (ref 11.1–15.9)
Hematocrit: 37.7 % (ref 34.0–46.6)
MCH: 30.8 pg (ref 26.6–33.0)
MCHC: 33.7 g/dL (ref 31.5–35.7)
MCV: 91 fL (ref 79–97)
PLATELETS: 268 10*3/uL (ref 150–379)
RBC: 4.13 x10E6/uL (ref 3.77–5.28)
RDW: 12.6 % (ref 12.3–15.4)
WBC: 6.5 10*3/uL (ref 3.4–10.8)

## 2016-07-18 LAB — COMPREHENSIVE METABOLIC PANEL
A/G RATIO: 1.7 (ref 1.2–2.2)
ALBUMIN: 4.2 g/dL (ref 3.5–5.5)
ALK PHOS: 48 IU/L (ref 39–117)
ALT: 11 IU/L (ref 0–32)
AST: 13 IU/L (ref 0–40)
BILIRUBIN TOTAL: 0.4 mg/dL (ref 0.0–1.2)
BUN / CREAT RATIO: 14 (ref 9–23)
BUN: 8 mg/dL (ref 6–24)
CHLORIDE: 100 mmol/L (ref 96–106)
CO2: 26 mmol/L (ref 18–29)
Calcium: 9.1 mg/dL (ref 8.7–10.2)
Creatinine, Ser: 0.59 mg/dL (ref 0.57–1.00)
GFR calc non Af Amer: 111 (ref >59)
GFR, EST AFRICAN AMERICAN: 128 (ref >59)
GLOBULIN, TOTAL: 2.5 (ref 1.5–4.5)
GLUCOSE: 80 mg/dL (ref 65–99)
POTASSIUM: 4.7 mmol/L (ref 3.5–5.2)
SODIUM: 140 mmol/L (ref 134–144)
TOTAL PROTEIN: 6.7 g/dL (ref 6.0–8.5)

## 2016-07-18 LAB — MAGNESIUM: MAGNESIUM: 2.1 mg/dL (ref 1.6–2.3)

## 2016-07-18 LAB — TSH: TSH: 0.756 u[IU]/mL (ref 0.450–4.500)

## 2016-07-18 NOTE — Patient Instructions (Signed)
Medication Instructions:  Your physician recommends that you continue on your current medications as directed. Please refer to the Current Medication list given to you today.   Labwork: TODAY:  CMET, MAG, CBC, & TSH  Testing/Procedures: Your physician has recommended that you wear an event monitor. Event monitors are medical devices that record the heart's electrical activity. Doctors most often [place] these monitors to diagnose arrhythmias. Arrhythmias are problems with the speed or rhythm of the heartbeat. The monitor is a small, portable device. You can wear one while you do your normal daily activities. This is usually used to diagnose what is causing palpitations/syncope (passing out).  Your physician has requested that you have cardiac CT. Cardiac computed tomography (CT) is a painless test that uses an x-ray machine to take clear, detailed pictures of your heart. For further information please visit [email or web address]tn. Please follow instruction sheet as given.   Follow-Up: Your physician recommends that you schedule a follow-up appointment in: 2 [place], PA-C AFTER THE HEART MONITOR   Any Other Special Instructions Will Be Listed Below (If Applicable).  Cardiac Event Monitoring A cardiac event monitor is a small recording device used to help detect abnormal heart rhythms (arrhythmias). The monitor is used to record heart rhythm when noticeable symptoms such as the following occur:  Fast heartbeats (palpitations), such as heart racing or fluttering.  Dizziness.  Fainting or light-headedness.  Unexplained weakness. The monitor is wired to two electrodes placed on your chest. Electrodes are flat, sticky disks that attach to your skin. The monitor can be worn for up to 30 days. You will wear the monitor at all times, except when bathing. How to use your cardiac event monitor A technician will prepare your chest for the electrode placement. The technician will show you how  to place the electrodes, how to work the monitor, and how to replace the batteries. Take time to practice using the monitor before you leave the office. Make sure you understand how to send the information from the monitor to your health care provider. This requires a telephone with a landline, not a cell phone. You need to:  Wear your monitor at all times, except when you are in water:  Do not get the monitor wet.  Take the monitor off when bathing. Do not swim or use a hot tub with it on.  Keep your skin clean. Do not put body lotion or moisturizer on your chest.  Change the electrodes daily or any time they stop sticking to your skin. You might need to use tape to keep them on.  It is possible that your skin under the electrodes could become irritated. To keep this from happening, try to put the electrodes in slightly different places on your chest. However, they must remain in the area under your left breast and in the upper right section of your chest.  Make sure the monitor is safely clipped to your clothing or in a location close to your body that your health care provider recommends.  Press the button to record when you feel symptoms of heart trouble, such as dizziness, weakness, light-headedness, palpitations, thumping, shortness of breath, unexplained weakness, or a fluttering or racing heart. The monitor is always on and records what happened slightly before you pressed the button, so do not worry about being too late to get good information.  Keep a diary of your activities, such as walking, doing chores, and taking medicine. It is especially important to note what you  were doing when you pushed the button to record your symptoms. This will help your health care provider determine what might be contributing to your symptoms. The information stored in your monitor will be reviewed by your health care provider alongside your diary entries.  Send the recorded information as recommended  by your health care provider. It is important to understand that it will take some time for your health care provider to process the results.  Change the batteries as recommended by your health care provider. Get help right away if:  You have chest pain.  You have extreme difficulty breathing or shortness of breath.  You develop a very fast heartbeat that persists.  You develop dizziness that does not go away.  You faint or constantly feel you are about to faint. This information is not intended to replace advice given to you by your health care provider. Make sure you discuss any questions you have with your health care provider. Document Released: 02/22/2008 Document Revised: 10/21/2015 Document Reviewed: 11/11/2012 Elsevier Interactive Patient Education  2017 [employer].

## 2016-07-20 ENCOUNTER — Ambulatory Visit (INDEPENDENT_AMBULATORY_CARE_PROVIDER_SITE_OTHER): Payer: 59

## 2016-07-20 ENCOUNTER — Other Ambulatory Visit: Payer: Self-pay | Admitting: Physician Assistant

## 2016-07-20 ENCOUNTER — Ambulatory Visit (INDEPENDENT_AMBULATORY_CARE_PROVIDER_SITE_OTHER)
Admission: RE | Admit: 2016-07-20 | Discharge: 2016-07-20 | Disposition: A | Discharge status: Home / Self Care | Payer: Self-pay | Source: Ambulatory Visit | Attending: Physician Assistant | Admitting: Physician Assistant

## 2016-07-20 DIAGNOSIS — R0789 Other chest pain: Secondary | ICD-10-CM

## 2016-07-20 DIAGNOSIS — R002 Palpitations: Secondary | ICD-10-CM

## 2016-07-26 ENCOUNTER — Telehealth: Payer: Self-pay | Admitting: Physician Assistant

## 2016-07-26 NOTE — Telephone Encounter (Signed)
Returned pts call and left her another message to call back.

## 2016-07-26 NOTE — Telephone Encounter (Signed)
Follow Up:    Returning call from yesterday,concerning lab results,

## 2016-07-27 NOTE — Telephone Encounter (Signed)
Follow up    Pt is returning call about her lab results

## 2016-07-27 NOTE — Telephone Encounter (Signed)
Returned pts call and she has been made aware of her CT and lab results.

## 2016-07-31 ENCOUNTER — Encounter: Payer: Self-pay | Admitting: Physician Assistant

## 2016-07-31 DIAGNOSIS — I341 Nonrheumatic mitral (valve) prolapse: Secondary | ICD-10-CM | POA: Insufficient documentation

## 2016-07-31 DIAGNOSIS — R002 Palpitations: Secondary | ICD-10-CM | POA: Insufficient documentation

## 2016-07-31 DIAGNOSIS — I340 Nonrheumatic mitral (valve) insufficiency: Secondary | ICD-10-CM | POA: Insufficient documentation

## 2016-07-31 DIAGNOSIS — R918 Other nonspecific abnormal finding of lung field: Secondary | ICD-10-CM | POA: Insufficient documentation

## 2016-07-31 DIAGNOSIS — R0789 Other chest pain: Secondary | ICD-10-CM | POA: Insufficient documentation

## 2016-07-31 HISTORY — DX: Other nonspecific abnormal finding of lung field: R91.8

## 2016-07-31 NOTE — Progress Notes (Signed)
Cardiology Office Note    Date:  08/02/2016  ID:  [name], DOB 08/26/1970, MRN [id number] PCP:  Anthoney Harada, MD  Cardiologist:  Reviewed with Dr. Curt Bears last visit   Chief Complaint: f/u chest pain  History of Present Illness:  Krista Meyer is a 46 y.o. 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 She was seen in clinic 07/18/16 for these issues. See note for complete details but episodic chest pain was grossly atypical, present intermittently since 2010, with some features suspicious for MSK etiology but also with history of GERD and recent PPI initiation. See office note 07/18/16 for full details on character. She described her palpitations as nightly, occurring when she lays down to go to sleep, described as her heart speeding up. TSH, CBC, Mg, CMET all wnl. 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 Due to above sx, calcium scoring was obtained which was 0. The scan did indicate tiny pulm nodules in the periphery of her lungs. Per the report these are statistically benign and of no concern but f/u CT scanning in 1 year at the discretion of her PCP was advised. 48 Hour holter final report pending but per review of preliminary report showed NSR/SA with rare PACs, PVCs, brief SVT - supraventricular beats accounted for 0.03% of the overall beats and ventricular ectopy accounted for <0.01%, average HR 66, min 41 at 5:23am. The fastest supraventricular run had a rate of 153 BPM and occurred at 2:04:16 PM. The longest run was 13 beats long and occurred at 11:09:00 PM(2).  She presents back today to discuss symptoms. She continues to have intermittent chest discomfort, non-exertional, worse after meals and also with certain movements of her arms. She did notice some improvement with empiric PPI therapy. She does report a history of  multiple digestive issues in the past and saw Dr. Michail Sermon remotely but was not having these acid reflux symptoms at that time, only chest pain. She says in general she is very hyperaware of all symptoms in her body at all times. She denies any acute change in symptoms. No SOB or syncope. Her palpitations are not particularly bothersome. She only felt the 1 episode of SVT during the monitoring period; did not feel the ectopy. She was on a field trip and had had sweet tea that day.    Past Medical History:  Diagnosis Date  . ADHD   . Depression   . Mild mitral regurgitation   . Mild mitral valve prolapse   . Pulmonary nodules 07/31/2016  . Restless leg syndrome   . Seizure disorder ([place])    dx at age 30    Past Surgical History:  Procedure Laterality Date  . NASAL SEPTUM SURGERY      Current Medications: Current Outpatient Prescriptions  Medication Sig Dispense Refill  . acetaminophen (TYLENOL) 325 MG tablet Take 325 mg by mouth every 6 (six) hours as needed for pain (prn).    Marland Kitchen FLUoxetine (PROZAC) 20 MG capsule Take 20 mg by mouth daily.    Marland Kitchen levETIRAcetam (KEPPRA XR) 500 MG 24 hr tablet Take 1 tablet (500 mg total) by mouth 2 (two) times daily. 180 tablet 3   No current facility-administered medications for this visit.      Allergies:   Iodine and Shellfish allergy   Social History   Social History  . Marital status:  Married    Spouse name: N/A  . Number of children: N/A  . Years of education: N/A   Social History Main Topics  . Smoking status: Never Smoker  . Smokeless tobacco: Never Used  . Alcohol use No  . Drug use: No  . Sexual activity: Not Asked   Other Topics Concern  . None   Social History Narrative  . None     Family History:  Family History  Problem Relation Age of Onset  . Diabetes insipidus Paternal Uncle   . Stroke Paternal Grandfather   . Diabetes insipidus Maternal Aunt   . Cancer Father     ROS:   Please see the history of present  illness.  All other systems are reviewed and otherwise negative.    PHYSICAL EXAM:   VS:  BP 98/60   Pulse 80   Ht 5\' 3"  (1.6 m)   Wt 148 lb 6.4 oz (67.3 kg)   SpO2 98%   BMI 26.29 kg/m   BMI: Body mass index is 26.29 kg/m. GEN: Well nourished, well developed WF, in no acute distress  HEENT: normocephalic, atraumatic Neck: no JVD, carotid bruits, or masses Cardiac: RRR; no murmurs, rubs, or gallops, no edema  Respiratory:  clear to auscultation bilaterally, normal work of breathing GI: soft, nontender, nondistended, + BS MS: no deformity or atrophy  Skin: warm and dry, no rash Neuro:  Alert and Oriented x 3, Strength and sensation are intact, follows commands Psych: euthymic mood, full affect  Wt Readings from Last 3 Encounters:  08/02/16 148 lb 6.4 oz (67.3 kg)  07/18/16 149 lb 12.8 oz (67.9 kg)  01/05/16 143 lb (64.9 kg)      Studies/Labs Reviewed:   EKG:  EKG was not ordered today.  Recent Labs: 07/18/2016: ALT 11; BUN 8; Creatinine, Ser 0.59; Magnesium 2.1; Platelets 268; Potassium 4.7; Sodium 140; TSH 0.756   Lipid Panel No results found for: CHOL, TRIG, HDL, CHOLHDL, VLDL, LDLCALC, LDLDIRECT  Additional studies/ records that were reviewed today include: Summarized above.    ASSESSMENT & PLAN:   1. Atypical chest pain - present since 2010 with features c/w MSK/neuropathic or GI etiology. Calcium score was zero thus unlikely to be cardiac in nature. Reassurance provided. Since omeprazole helped, I've encouraged her to continue taking this for now. I will add a 2 week trial of sucralfate in case her symptoms represent a flare of gastritis or PUD. Recommend she f/u with GI for further evaluation. She previously saw Dr. Michail Sermon. I have asked her to contact his office to arrange a follow-up appointment. She also plans to follow up with her PCP since he is managing her Prozac. If the GI workup is unremarkable, it may be worthwhile to consider a transition from Prozac to  Cymbalta for possible neuropathic component. Will defer to PCP. 2. Palpitations due to brief SVT on telemetry, also with rare PACs/PVCs that were asymptomatic - we discussed possibility of treatment for these including beta blocker. However, with low burden of symptoms and tendency for low blood pressure, will continue to follow symptoms for now. She will let us know if these become more persistent or bothersome. We discussed avoidance of triggers such as excess caffeine, lack of sleep, stimulant meds. 3. 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 This was only mild on exam in 2010. Will follow clinically for now.   Disposition: F/u with cardiology as needed. The patient relates that she does not make a lot of money  and does not have a lot to be seeing specialists regularly. I think it would be appropriate for the above issues to be followed by primary care and we are happy to help in the future if needed. I've asked the patient to contact our office if she has any future questions or issues at all.   Medication Adjustments/Labs and Tests Ordered: Current medicines are reviewed at length with the patient today.  Concerns regarding medicines are outlined above. Medication changes, Labs and Tests ordered today are summarized above and listed in the Patient Instructions accessible in Encounters.   Raechel Ache PA-C  08/02/2016 8:02 AM    [address] [place], [place], [place]  [address] Phone: [phone]; Fax: [phone]

## 2016-08-02 ENCOUNTER — Encounter: Payer: Self-pay | Admitting: Physician Assistant

## 2016-08-02 ENCOUNTER — Encounter (INDEPENDENT_AMBULATORY_CARE_PROVIDER_SITE_OTHER): Payer: Self-pay

## 2016-08-02 ENCOUNTER — Ambulatory Visit (INDEPENDENT_AMBULATORY_CARE_PROVIDER_SITE_OTHER): Payer: 59 | Admitting: Physician Assistant

## 2016-08-02 VITALS — BP 98/60 | HR 80 | Ht 63.0 in | Wt 148.4 lb

## 2016-08-02 DIAGNOSIS — I471 Supraventricular tachycardia, unspecified: Secondary | ICD-10-CM

## 2016-08-02 DIAGNOSIS — R002 Palpitations: Secondary | ICD-10-CM

## 2016-08-02 DIAGNOSIS — I341 Nonrheumatic mitral (valve) prolapse: Secondary | ICD-10-CM

## 2016-08-02 DIAGNOSIS — I491 Atrial premature depolarization: Secondary | ICD-10-CM | POA: Diagnosis not present

## 2016-08-02 DIAGNOSIS — I340 Nonrheumatic mitral (valve) insufficiency: Secondary | ICD-10-CM | POA: Diagnosis not present

## 2016-08-02 DIAGNOSIS — I493 Ventricular premature depolarization: Secondary | ICD-10-CM

## 2016-08-02 DIAGNOSIS — R0789 Other chest pain: Secondary | ICD-10-CM

## 2016-08-02 DIAGNOSIS — R918 Other nonspecific abnormal finding of lung field: Secondary | ICD-10-CM

## 2016-08-02 MED ORDER — SUCRALFATE 1 G PO TABS: 1.0000 g | ORAL_TABLET | Freq: Four times a day (QID) | ORAL | 0 refills | Status: DC

## 2016-08-02 NOTE — Patient Instructions (Signed)
Medication Instructions:  START sucralfate (carafate) 1 gram four times a day.  Labwork: None ordered  Testing/Procedures: None ordered  Follow-Up: As needed.   Call to schedule a appointment with Dr. Michail Sermon at [facility] at [phone].  Any Other Special Instructions Will Be Listed Below (If Applicable).     If you need a refill on your cardiac medications before your next appointment, please call your pharmacy.

## 2016-11-27 ENCOUNTER — Encounter (INDEPENDENT_AMBULATORY_CARE_PROVIDER_SITE_OTHER): Payer: Self-pay

## 2016-11-27 ENCOUNTER — Ambulatory Visit (INDEPENDENT_AMBULATORY_CARE_PROVIDER_SITE_OTHER): Payer: 59 | Admitting: Psychiatry

## 2016-11-27 ENCOUNTER — Encounter (HOSPITAL_COMMUNITY): Payer: Self-pay | Admitting: Psychiatry

## 2016-11-27 VITALS — BP 112/68 | HR 93 | Ht 63.0 in | Wt 146.4 lb

## 2016-11-27 DIAGNOSIS — F3341 Major depressive disorder, recurrent, in partial remission: Secondary | ICD-10-CM

## 2016-11-27 DIAGNOSIS — G40802 Other epilepsy, not intractable, without status epilepticus: Secondary | ICD-10-CM

## 2016-11-27 DIAGNOSIS — G8929 Other chronic pain: Secondary | ICD-10-CM

## 2016-11-27 DIAGNOSIS — F909 Attention-deficit hyperactivity disorder, unspecified type: Secondary | ICD-10-CM

## 2016-11-27 DIAGNOSIS — Z818 Family history of other mental and behavioral disorders: Secondary | ICD-10-CM

## 2016-11-27 DIAGNOSIS — Z79899 Other long term (current) drug therapy: Secondary | ICD-10-CM

## 2016-11-27 MED ORDER — VENLAFAXINE HCL ER 75 MG PO CP24: 150.0000 mg | ORAL_CAPSULE | Freq: Every day | ORAL | 0 refills | Status: DC

## 2016-11-27 NOTE — Patient Instructions (Signed)
Start Effexor 75 mg XR x 2 weeks, then increase to 150 mg XR daily  ALWAYS take Effexor in the morning If you are not having any side effects after 1 week, then you can go ahead and increase to 150 mg XR   STOP Prozac

## 2016-11-27 NOTE — Progress Notes (Signed)
Psychiatric Initial Adult Assessment   Patient Identification: Krista Meyer MRN:  [id number] Date of Evaluation:  11/27/2016 Referral Source: self Chief Complaint:   Visit Diagnosis:    ICD-10-CM   1. 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 She presents today to establish psychiatric care, as her previous provider no longer takes her insurance.  She was seeing Dr. Toy Care.  She has been tried on Prozac, Zoloft, Pristiq, and most recently has been continued on Prozac 20 mg daily. She reports that this is been for symptoms of depression, namely low motivation, low energy, difficulty concentrating, periods of apathy. She also struggles with some anxiety and rumination symptoms. She denies any suicidality, but does tend to fantasize about running away from her problems and "leaving it all behind" by starting a new life. She denies any drug or alcohol abuse. 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 She reports that when she was on Pristiq, she tended to do better with her pain and with her mood, but the cost was quite high. She reports that she has never been tried on Effexor, but she would be interested in trying this medication. She reports that she is also struggled with some perimenopausal symptoms such as hot flashes, night sweats.  We spent time discussing the dosing and mechanism of Effexor, and agreed to start at 75 mg XL or 2 weeks and increase to 150 mg XL as tolerated. She was instructed to discontinue  Prozac.  She is never had any symptoms of mania or psychosis. She is never had any psychiatric hospitalizations. She has an excellent support system with her husband, and they have plenty of outdoor hobbies and artistic endeavors that keep him busy. They do not have any children. She sees Dr. Redmond Pulling for psychology every month approximately.  Associated Signs/Symptoms: Depression Symptoms:  depressed mood, anhedonia, insomnia, fatigue, difficulty concentrating, anxiety, (Hypo) Manic Symptoms:  none Anxiety Symptoms:  Excessive Worry, Psychotic Symptoms:  none PTSD Symptoms: Negative  Past Psychiatric History: Outpatient psychiatric treatment, no hospitalizations  Previous Psychotropic Medications: Yes   Substance Abuse History in the last 12 months:  No.  Consequences of Substance Abuse: Negative  Past Medical History:  Past Medical History:  Diagnosis Date  . ADHD   . Depression   . Mild mitral regurgitation   . Mild mitral valve prolapse   . Pulmonary nodules 07/31/2016  . Restless leg syndrome   . Seizure disorder ([place])    dx at age 58    Past Surgical History:  Procedure Laterality Date  . NASAL SEPTUM SURGERY      Family Psychiatric History: Family history of anxiety, they did not receive treatment  Family History:  Family History  Problem Relation Age of Onset  . Diabetes insipidus Paternal Uncle   . Stroke Paternal Grandfather   . Diabetes insipidus Maternal Aunt   . Cancer Father     Social History:   Social History   Social History  . Marital status: Married    Spouse name: N/A  .  Number of children: N/A  . Years of education: N/A   Social History Main Topics  . Smoking status: Never Smoker  . Smokeless tobacco: Never Used  . Alcohol use No  . Drug use: No  . Sexual activity: Not on file   Other Topics Concern  . Not on file   Social History Narrative  . No narrative on file    Additional Social History: Currently works at a [employer], has a bachelor's degree in early childhood education  Allergies:   Allergies  Allergen Reactions  . Iodine     "Burns my skin"  . Shellfish Allergy     unknown    Metabolic Disorder Labs: No results found for: HGBA1C, MPG No results found for: PROLACTIN No results found for: CHOL, TRIG, HDL, CHOLHDL, VLDL, LDLCALC   Current Medications: Current Outpatient Prescriptions  Medication Sig Dispense Refill  . acetaminophen (TYLENOL) 325 MG tablet Take 325 mg by mouth every 6 (six) hours as needed for pain (prn).    . levETIRAcetam (KEPPRA XR) 500 MG 24 hr tablet Take 1 tablet (500 mg total) by mouth 2 (two) times daily. 180 tablet 3  . traMADol (ULTRAM) 50 MG tablet Take 50 mg by mouth daily as needed.    . venlafaxine XR (EFFEXOR XR) 75 MG 24 hr capsule Take 2 capsules (150 mg total) by mouth daily. Take 1 capsule daily x 2 weeks, and then increase to 2 capsules daily 180 capsule 0   No current facility-administered medications for this visit.     Neurologic: Headache: Negative Seizure: Negative Paresthesias:Negative  Musculoskeletal: Strength & Muscle Tone: within normal limits Gait & Station: normal Patient leans: N/A  Psychiatric Specialty Exam: ROS  Blood pressure 112/68, pulse 93, height 5\' 3"  (1.6 m), weight 146 lb 6.4 oz (66.4 kg).Body mass index is 25.93 kg/m.  General Appearance: Casual and Fairly Groomed  Eye Contact:  Good  Speech:  Clear and Coherent  Volume:  Normal  Mood:  Anxious  Affect:  Congruent  Thought Process:  Goal Directed  Orientation:  Full (Time, Place, and Person)  Thought Content:  Logical  Suicidal Thoughts:  No  Homicidal Thoughts:  No  Memory:  Immediate;   Fair  Judgement:  Fair  Insight:  Fair  Psychomotor Activity:  Normal  Concentration:  Concentration: Good  Recall:  Good  Fund of Knowledge:Good  Language: Good  Akathisia:  Negative  Handed:  Right  AIMS (if indicated):  0  Assets:  Communication Skills Desire  for Improvement Financial Resources/Insurance Housing Intimacy Leisure Time Physical Health Resilience Social Support Talents/Skills Transportation Vocational/Educational  ADL's:  Intact  Cognition: WNL  Sleep:  6-7 hours    Treatment Plan Summary: Krista Meyer is a 46 year old female with a history of temporal lobe epilepsy controlled with Keppra and MDD with anxious features, who presents today to establish psychiatric care. Her previous provider no longer accepts her health insurance. Reviewing her history, she appears to continue struggling with periods of depression, and did better on Pristiq in the past. She has never been tried on Effexor, so we agreed to initiate as below, and follow-up in 6 weeks. She will continue with outpatient therapy with Dr. Redmond Pulling. No acute safety issues and no significant substance abuse.  1. Recurrent major depressive disorder, in partial remission (HCC)    Initiate Effexor 75 mg XR 2 weeks, then increase to 150 mg XR Discontinue Prozac Follow-up in 6 weeks Continue in therapy with Dr.  Janit Pagan, MD 7/2/20183:05 PM

## 2016-12-18 ENCOUNTER — Other Ambulatory Visit (HOSPITAL_COMMUNITY): Payer: Self-pay

## 2016-12-18 DIAGNOSIS — F3341 Major depressive disorder, recurrent, in partial remission: Secondary | ICD-10-CM

## 2016-12-18 MED ORDER — VENLAFAXINE HCL ER 75 MG PO CP24: 150.0000 mg | ORAL_CAPSULE | Freq: Every day | ORAL | 0 refills | Status: DC

## 2016-12-18 NOTE — Progress Notes (Signed)
Patient called and said she was doing well on her medication, she needed a 90 day order of the Effexor sent to [facility]. There was a 90 day sent to [facility] at the beginning of the month, but patient does not use them. I resent the order to [facility] and let patient know it was done.

## 2017-01-08 ENCOUNTER — Ambulatory Visit: Payer: 59 | Admitting: Neurology

## 2017-01-10 ENCOUNTER — Encounter (HOSPITAL_COMMUNITY): Payer: Self-pay | Admitting: Psychiatry

## 2017-01-10 ENCOUNTER — Ambulatory Visit (INDEPENDENT_AMBULATORY_CARE_PROVIDER_SITE_OTHER): Payer: 59 | Admitting: Psychiatry

## 2017-01-10 DIAGNOSIS — Z79899 Other long term (current) drug therapy: Secondary | ICD-10-CM | POA: Diagnosis not present

## 2017-01-10 DIAGNOSIS — G40909 Epilepsy, unspecified, not intractable, without status epilepticus: Secondary | ICD-10-CM

## 2017-01-10 DIAGNOSIS — F3341 Major depressive disorder, recurrent, in partial remission: Secondary | ICD-10-CM

## 2017-01-10 DIAGNOSIS — G4700 Insomnia, unspecified: Secondary | ICD-10-CM | POA: Diagnosis not present

## 2017-01-10 MED ORDER — VENLAFAXINE HCL ER 150 MG PO CP24: 150.0000 mg | ORAL_CAPSULE | Freq: Every day | ORAL | 1 refills | Status: DC
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
## 2017-01-23 ENCOUNTER — Ambulatory Visit: Payer: Self-pay | Admitting: Neurology

## 2017-01-23 DIAGNOSIS — Z029 Encounter for administrative examinations, unspecified: Secondary | ICD-10-CM

## 2017-01-29 ENCOUNTER — Other Ambulatory Visit: Payer: Self-pay | Admitting: Neurology

## 2017-01-31 ENCOUNTER — Encounter: Payer: Self-pay | Admitting: Neurology

## 2017-02-12 ENCOUNTER — Telehealth (HOSPITAL_COMMUNITY): Payer: Self-pay | Admitting: Psychiatry

## 2017-02-12 NOTE — Telephone Encounter (Signed)
Called patient to discuss her side effects, She is on Effexor 150 mg. There was no answer, I left a HIPAA compliant message with my direct number for patient to call me back.

## 2017-02-13 ENCOUNTER — Other Ambulatory Visit (HOSPITAL_COMMUNITY): Payer: Self-pay | Admitting: Psychiatry

## 2017-02-13 ENCOUNTER — Telehealth (HOSPITAL_COMMUNITY): Payer: Self-pay

## 2017-02-13 MED ORDER — VENLAFAXINE HCL ER 75 MG PO CP24: 75.0000 mg | ORAL_CAPSULE | Freq: Every day | ORAL | 0 refills | Status: DC

## 2017-02-13 NOTE — Telephone Encounter (Signed)
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 She states she has been on it now for a few months and she is feeling like she is deteriorating. She would like to know if she can stop the medication. She has made a follow up with Dr. Daron Offer for next month.

## 2017-02-13 NOTE — Telephone Encounter (Signed)
Called and lvm for patient with medication instructions and my number to call with any questions

## 2017-02-13 NOTE — Telephone Encounter (Signed)
She can try reducing the dose to see if side effects go away.  Please call Effexor 75 mg once a day.

## 2017-02-21 NOTE — Telephone Encounter (Signed)
Thank you for the update!

## 2017-02-26 ENCOUNTER — Encounter (HOSPITAL_COMMUNITY): Payer: Self-pay | Admitting: Psychiatry

## 2017-02-26 ENCOUNTER — Ambulatory Visit (INDEPENDENT_AMBULATORY_CARE_PROVIDER_SITE_OTHER): Payer: 59 | Admitting: Psychiatry

## 2017-02-26 VITALS — BP 122/70 | HR 75 | Ht 63.0 in | Wt 143.8 lb

## 2017-02-26 DIAGNOSIS — G40802 Other epilepsy, not intractable, without status epilepticus: Secondary | ICD-10-CM

## 2017-02-26 DIAGNOSIS — F3341 Major depressive disorder, recurrent, in partial remission: Secondary | ICD-10-CM | POA: Diagnosis not present

## 2017-02-26 DIAGNOSIS — F3281 Premenstrual dysphoric disorder: Secondary | ICD-10-CM

## 2017-02-26 MED ORDER — VENLAFAXINE HCL ER 75 MG PO CP24: 75.0000 mg | ORAL_CAPSULE | Freq: Every day | ORAL | 1 refills | Status: DC

## 2017-02-26 NOTE — Progress Notes (Signed)
BH MD/PA/NP OP Progress Note  02/26/2017 8:34 AM Krista Meyer  MRN:  [id number]  Chief Complaint:  HPI: Krista Meyer presents today for early follow-up due to side effects from Effexor. She reports that the decreased to 75 mg was well tolerated, and she feels like her mood is doing better. Reviewing her history, it appears that she was having pain related to endometriosis, and began taking tramadol, which she is prescribed, but rarely takes. She began taking tramadol daily during the period where she was having endometriosis related cramps, and I suspect this interacted with Effexor 150 mg, and she had some slight symptoms of serotonin syndrome.  She reports that she is no longer taking tramadol except for sparingly on a weekly basis when she has abdominal cramps and pain. She reports that the Effexor 75 mg better tolerated. She denies any acute safety issues and reports that her mood is back to euthymic. She reports that she is sleeping well. Denies any irritability or anger. Agrees to follow-up with this Probation officer as scheduled in December. No recent change in neurology care or seizures.  Visit Diagnosis:    ICD-10-CM   1. Recurrent major depressive disorder, in partial remission (HCC) F33.41 venlafaxine XR (EFFEXOR XR) 75 MG 24 hr capsule  2. PMDD (premenstrual dysphoric disorder) F32.81     Past Psychiatric History: Reviewed with no updates at this time  Past Medical History:  Past Medical History:  Diagnosis Date  . ADHD   . Depression   . Mild mitral regurgitation   . Mild mitral valve prolapse   . Pulmonary nodules 07/31/2016  . Restless leg syndrome   . Seizure disorder ([place])    dx at age 39    Past Surgical History:  Procedure Laterality Date  . NASAL SEPTUM SURGERY      Family Psychiatric History: See intake H&P for full details. Reviewed, with no updates at this time.  Family History:  Family History  Problem Relation Age of Onset  . Diabetes insipidus  Paternal Uncle   . Stroke Paternal Grandfather   . Diabetes insipidus Maternal Aunt   . Cancer Father     Social History:  Social History   Social History  . Marital status: Married    Spouse name: N/A  . Number of children: N/A  . Years of education: N/A   Social History Main Topics  . Smoking status: Never Smoker  . Smokeless tobacco: Never Used  . Alcohol use No  . Drug use: No  . Sexual activity: Not Asked   Other Topics Concern  . None   Social History Narrative  . None    Allergies:  Allergies  Allergen Reactions  . Iodine     "Burns my skin"  . Shellfish Allergy     unknown    Metabolic Disorder Labs: No results found for: HGBA1C, MPG No results found for: PROLACTIN No results found for: CHOL, TRIG, HDL, CHOLHDL, VLDL, LDLCALC Lab Results  Component Value Date   TSH 0.756 07/18/2016    Therapeutic Level Labs: No results found for: LITHIUM No results found for: VALPROATE No components found for:  CBMZ  Current Medications: Current Outpatient Prescriptions  Medication Sig Dispense Refill  . acetaminophen (TYLENOL) 325 MG tablet Take 325 mg by mouth every 6 (six) hours as needed for pain (prn).    . levETIRAcetam (KEPPRA XR) 500 MG 24 hr tablet TAKE 1 TABLET BY MOUTH TWO  TIMES DAILY 180 tablet 0  . venlafaxine  XR (EFFEXOR XR) 75 MG 24 hr capsule Take 1 capsule (75 mg total) by mouth daily with breakfast. 90 capsule 1  . traMADol (ULTRAM) 50 MG tablet Take 50 mg by mouth daily as needed.     No current facility-administered medications for this visit.      Musculoskeletal: Strength & Muscle Tone: within normal limits Gait & Station: normal Patient leans: N/A  Psychiatric Specialty Exam: Review of Systems  Constitutional: Negative.   HENT: Negative.   Respiratory: Negative.   Cardiovascular: Negative.   Gastrointestinal: Negative.   Musculoskeletal: Negative.   Neurological: Negative.   Psychiatric/Behavioral: Negative.  Negative for  depression, hallucinations, memory loss, substance abuse and suicidal ideas. The patient is not nervous/anxious and does not have insomnia.     Blood pressure 122/70, pulse 75, height 5\' 3"  (1.6 m), weight 143 lb 12.8 oz (65.2 kg).Body mass index is 25.47 kg/m.  General Appearance: Casual and Well Groomed  Eye Contact:  Good  Speech:  Clear and Coherent  Volume:  Normal  Mood:  Euthymic  Affect:  Appropriate and Congruent  Thought Process:  Coherent and Goal Directed  Orientation:  Full (Time, Place, and Person)  Thought Content: Logical   Suicidal Thoughts:  No  Homicidal Thoughts:  No  Memory:  Immediate;   Good  Judgement:  Good  Insight:  Good  Psychomotor Activity:  Normal  Concentration:  Concentration: Good  Recall:  Good  Fund of Knowledge: Good  Language: Good  Akathisia:  Negative  Handed:  Right  AIMS (if indicated): not done  Assets:  Communication Skills Desire for Improvement Financial Resources/Insurance Housing Intimacy Leisure Time Physical Health Resilience Social Support Talents/Skills Transportation Vocational/Educational  ADL's:  Intact  Cognition: WNL  Sleep:  Good   Screenings:   Assessment and Plan: Krista Meyer is a 46 year old female with temporal lobe epilepsy controlled with Keppra, who presents today for medication management for depression. She likely had some recent mild serotonin syndrome, while she was taking prescribed tramadol for endometriosis related cramps. She describes increased anxiety, restlessness, irritability, but also describes constipation related to tramadol, which has resolved.  She has responded well to decrease in Effexor and continues on 75 mg XR daily.  No ongoing medication side effects at this time. No acute safety issues, and she has an excellent support system. We will follow-up in 3 months or sooner if needed.  1. Recurrent major depressive disorder, in partial remission ([place])   2. PMDD (premenstrual  dysphoric disorder)    Continue Effexor 75 mg XR daily Return to clinic in 3 months Continue in individual therapy  Aundra Dubin, MD 02/26/2017, 8:34 AM

## 2017-03-19 NOTE — H&P (Addendum)
Krista Meyer is an 46 y.o. female. W/AUB, found to has polyps on SIS.  Pertinent Gynecological History: Menses: flow is excessive with use of 10-12 pads or tampons on heaviest days Bleeding: dysfunctional uterine bleeding Contraception: none DES exposure: denies Blood transfusions: none Sexually transmitted diseases: no past history Previous GYN Procedures: None  Last pap: normal Date: 2018 OB History: G0   Menstrual History: No LMP recorded. Patient is not currently having periods (Reason: Perimenopausal).    Past Medical History:  Diagnosis Date  . ADHD   . Depression   . Mild mitral regurgitation   . Mild mitral valve prolapse   . Pulmonary nodules 07/31/2016  . Restless leg syndrome   . Seizure disorder ([place])    dx at age 27    Past Surgical History:  Procedure Laterality Date  . NASAL SEPTUM SURGERY      Family History  Problem Relation Age of Onset  . Diabetes insipidus Paternal Uncle   . Stroke Paternal Grandfather   . Diabetes insipidus Maternal Aunt   . Cancer Father     Social History:  reports that she has never smoked. She has never used smokeless tobacco. She reports that she does not drink alcohol or use drugs.  Allergies:  Allergies  Allergen Reactions  . Iodine     "Burns my skin"  . Shellfish Allergy     unknown    No prescriptions prior to admission.  Meds: Keppra 500mg  Venlafaxine 75mg   ROS  There were no vitals taken for this visit. Physical Exam  Gen: well appearing, NAD CV: Reg rate Pulm: NWOB Abd: soft, nondistended, nontender, no mass GYN: uterus 8 week size, no adnexa ttp/CMT, + atrophy Ext: No edema b/l   No results found for this or any previous visit (from the past 24 hour(s)).  No results found.  Assessment/Plan: 46 yo w/h/o AUB and found to have polyps on SIS presents for scheduled surgery. Declined in office EMB. Plan for [facility] with D&C myosure polypectomy and novasure ablation. Risks discussed including  infxn, bleeding, damage to surrounding structures/uterus, fluid overload, risk of not being able to tell if has enodmetrial cancer in the future, DVT, blood transfusion, and additional procedures. Patient also understands that an ablation is not birth control. All questions answered and consent signed in clinic. Desires to proceed with schedule surgery.    Tyson Dense 03/19/2017, 5:24 PM   No updates to above H&P. Patient arrived NPO and was consented in PACU. Risks discussed including infection, bleeding, damage to surrounding structures,  need for additional procedures, fluid overload, failure of ablation, risk of not being able to tell if has enodmetrial cancer in the future, DVT, blood transfusion, and additional procedures. All questions answered. Consent signed. Proceed with above surgery.   Lucillie Garfinkel MD

## 2017-03-21 ENCOUNTER — Encounter (HOSPITAL_BASED_OUTPATIENT_CLINIC_OR_DEPARTMENT_OTHER): Payer: Self-pay | Admitting: *Deleted

## 2017-03-22 ENCOUNTER — Encounter (HOSPITAL_BASED_OUTPATIENT_CLINIC_OR_DEPARTMENT_OTHER): Payer: Self-pay | Admitting: *Deleted

## 2017-03-22 NOTE — Progress Notes (Signed)
NPO AFTER MN.  ARRIVE AT 0530.  NEEDS HG AND URINE PREG.  WILL TAKE AM MEDS W/ SIPS OF WATER.

## 2017-03-30 ENCOUNTER — Encounter (HOSPITAL_BASED_OUTPATIENT_CLINIC_OR_DEPARTMENT_OTHER)
Admission: RE | Disposition: A | Discharge status: Home / Self Care | Payer: Self-pay | Source: Ambulatory Visit | Attending: Obstetrics and Gynecology

## 2017-03-30 ENCOUNTER — Encounter (HOSPITAL_BASED_OUTPATIENT_CLINIC_OR_DEPARTMENT_OTHER): Payer: Self-pay | Admitting: *Deleted

## 2017-03-30 ENCOUNTER — Ambulatory Visit (HOSPITAL_BASED_OUTPATIENT_CLINIC_OR_DEPARTMENT_OTHER): Payer: 59 | Admitting: Anesthesiology

## 2017-03-30 ENCOUNTER — Ambulatory Visit (HOSPITAL_BASED_OUTPATIENT_CLINIC_OR_DEPARTMENT_OTHER)
Admission: RE | Admit: 2017-03-30 | Discharge: 2017-03-30 | Disposition: A | Discharge status: Home / Self Care | Payer: 59 | Source: Ambulatory Visit | Attending: Obstetrics and Gynecology | Admitting: Obstetrics and Gynecology

## 2017-03-30 DIAGNOSIS — R918 Other nonspecific abnormal finding of lung field: Secondary | ICD-10-CM | POA: Insufficient documentation

## 2017-03-30 DIAGNOSIS — G2581 Restless legs syndrome: Secondary | ICD-10-CM | POA: Diagnosis not present

## 2017-03-30 DIAGNOSIS — I341 Nonrheumatic mitral (valve) prolapse: Secondary | ICD-10-CM | POA: Diagnosis not present

## 2017-03-30 DIAGNOSIS — G40909 Epilepsy, unspecified, not intractable, without status epilepticus: Secondary | ICD-10-CM | POA: Insufficient documentation

## 2017-03-30 DIAGNOSIS — F909 Attention-deficit hyperactivity disorder, unspecified type: Secondary | ICD-10-CM | POA: Insufficient documentation

## 2017-03-30 DIAGNOSIS — N939 Abnormal uterine and vaginal bleeding, unspecified: Secondary | ICD-10-CM | POA: Diagnosis not present

## 2017-03-30 DIAGNOSIS — Z79899 Other long term (current) drug therapy: Secondary | ICD-10-CM | POA: Insufficient documentation

## 2017-03-30 DIAGNOSIS — F329 Major depressive disorder, single episode, unspecified: Secondary | ICD-10-CM | POA: Insufficient documentation

## 2017-03-30 HISTORY — DX: Abnormal uterine and vaginal bleeding, unspecified: N93.9

## 2017-03-30 HISTORY — DX: Localization-related (focal) (partial) symptomatic epilepsy and epileptic syndromes with complex partial seizures, not intractable, without status epilepticus: G40.209

## 2017-03-30 HISTORY — DX: Personal history of other medical treatment: Z92.89

## 2017-03-30 HISTORY — DX: Major depressive disorder, single episode, unspecified: F32.9

## 2017-03-30 HISTORY — PX: DILITATION & CURRETTAGE/HYSTROSCOPY WITH NOVASURE ABLATION: SHX5568

## 2017-03-30 HISTORY — DX: Palpitations: R00.2

## 2017-03-30 LAB — POCT PREGNANCY, URINE: Preg Test, Ur: NEGATIVE

## 2017-03-30 LAB — POCT HEMOGLOBIN-HEMACUE: HEMOGLOBIN: 12.9 g/dL (ref 12.0–15.0)

## 2017-03-30 SURGERY — DILATATION & CURETTAGE/HYSTEROSCOPY WITH NOVASURE ABLATION
Anesthesia: General | Site: Vagina

## 2017-03-30 MED ORDER — ONDANSETRON HCL 4 MG PO TABS
4.0000 mg | ORAL_TABLET | Freq: Four times a day (QID) | ORAL | Status: DC | PRN
  Filled 2017-03-30: qty 1

## 2017-03-30 MED ORDER — OXYCODONE HCL 5 MG PO TABS: 5.0000 mg | ORAL_TABLET | ORAL | 0 refills | Status: DC | PRN

## 2017-03-30 MED ORDER — FENTANYL CITRATE (PF) 100 MCG/2ML IJ SOLN
INTRAMUSCULAR | Status: AC
  Filled 2017-03-30: qty 2

## 2017-03-30 MED ORDER — LIDOCAINE 2% (20 MG/ML) 5 ML SYRINGE
INTRAMUSCULAR | Status: DC | PRN
  Administered 2017-03-30: 80 mg via INTRAVENOUS

## 2017-03-30 MED ORDER — EPHEDRINE SULFATE-NACL 50-0.9 MG/10ML-% IV SOSY
PREFILLED_SYRINGE | INTRAVENOUS | Status: DC | PRN
  Administered 2017-03-30: 10 mg via INTRAVENOUS

## 2017-03-30 MED ORDER — FENTANYL CITRATE (PF) 100 MCG/2ML IJ SOLN
25.0000 ug | INTRAMUSCULAR | Status: DC | PRN
  Filled 2017-03-30: qty 1

## 2017-03-30 MED ORDER — EPHEDRINE 5 MG/ML INJ
INTRAVENOUS | Status: AC
  Filled 2017-03-30: qty 10

## 2017-03-30 MED ORDER — ACETAMINOPHEN 325 MG PO TABS
325.0000 mg | ORAL_TABLET | ORAL | Status: DC | PRN
  Filled 2017-03-30: qty 2

## 2017-03-30 MED ORDER — KETOROLAC TROMETHAMINE 30 MG/ML IJ SOLN
INTRAMUSCULAR | Status: AC
  Filled 2017-03-30: qty 1

## 2017-03-30 MED ORDER — IBUPROFEN 600 MG PO TABS
600.0000 mg | ORAL_TABLET | Freq: Four times a day (QID) | ORAL | Status: DC | PRN
  Filled 2017-03-30: qty 1

## 2017-03-30 MED ORDER — DOCUSATE SODIUM 100 MG PO CAPS: 100.0000 mg | ORAL_CAPSULE | Freq: Two times a day (BID) | ORAL | 2 refills | Status: AC

## 2017-03-30 MED ORDER — OXYCODONE HCL 5 MG/5ML PO SOLN
5.0000 mg | Freq: Once | ORAL | Status: DC | PRN
  Filled 2017-03-30: qty 5

## 2017-03-30 MED ORDER — OXYCODONE HCL 5 MG PO TABS
5.0000 mg | ORAL_TABLET | Freq: Once | ORAL | Status: DC | PRN
  Filled 2017-03-30: qty 1

## 2017-03-30 MED ORDER — ACETAMINOPHEN 160 MG/5ML PO SOLN
325.0000 mg | ORAL | Status: DC | PRN
  Filled 2017-03-30: qty 20.3

## 2017-03-30 MED ORDER — ONDANSETRON HCL 4 MG/2ML IJ SOLN
INTRAMUSCULAR | Status: AC
  Filled 2017-03-30: qty 2

## 2017-03-30 MED ORDER — LACTATED RINGERS IV SOLN
INTRAVENOUS | Status: DC
  Administered 2017-03-30 (×2): via INTRAVENOUS
  Filled 2017-03-30: qty 1000

## 2017-03-30 MED ORDER — ARTIFICIAL TEARS OPHTHALMIC OINT
TOPICAL_OINTMENT | OPHTHALMIC | Status: AC
  Filled 2017-03-30: qty 3.5

## 2017-03-30 MED ORDER — DEXAMETHASONE SODIUM PHOSPHATE 10 MG/ML IJ SOLN
INTRAMUSCULAR | Status: AC
  Filled 2017-03-30: qty 1

## 2017-03-30 MED ORDER — PROPOFOL 10 MG/ML IV BOLUS
INTRAVENOUS | Status: AC
  Filled 2017-03-30: qty 40

## 2017-03-30 MED ORDER — MIDAZOLAM HCL 2 MG/2ML IJ SOLN
INTRAMUSCULAR | Status: AC
  Filled 2017-03-30: qty 2

## 2017-03-30 MED ORDER — PROMETHAZINE HCL 25 MG/ML IJ SOLN
6.2500 mg | INTRAMUSCULAR | Status: DC | PRN
  Filled 2017-03-30: qty 1

## 2017-03-30 MED ORDER — ONDANSETRON HCL 4 MG/2ML IJ SOLN
INTRAMUSCULAR | Status: DC | PRN
  Administered 2017-03-30: 4 mg via INTRAVENOUS

## 2017-03-30 MED ORDER — OXYCODONE-ACETAMINOPHEN 5-325 MG PO TABS
1.0000 | ORAL_TABLET | ORAL | Status: DC | PRN
  Filled 2017-03-30: qty 2

## 2017-03-30 MED ORDER — ONDANSETRON HCL 4 MG/2ML IJ SOLN
4.0000 mg | Freq: Four times a day (QID) | INTRAMUSCULAR | Status: DC | PRN
Start: 2017-03-30 — End: 2017-03-30
  Filled 2017-03-30: qty 2

## 2017-03-30 MED ORDER — KETOROLAC TROMETHAMINE 30 MG/ML IJ SOLN
INTRAMUSCULAR | Status: DC | PRN
  Administered 2017-03-30: 30 mg via INTRAVENOUS

## 2017-03-30 MED ORDER — DEXAMETHASONE SODIUM PHOSPHATE 4 MG/ML IJ SOLN
INTRAMUSCULAR | Status: DC | PRN
  Administered 2017-03-30: 10 mg via INTRAVENOUS

## 2017-03-30 MED ORDER — MIDAZOLAM HCL 5 MG/5ML IJ SOLN
INTRAMUSCULAR | Status: DC | PRN
  Administered 2017-03-30: 2 mg via INTRAVENOUS

## 2017-03-30 MED ORDER — LIDOCAINE 2% (20 MG/ML) 5 ML SYRINGE
INTRAMUSCULAR | Status: AC
  Filled 2017-03-30: qty 5

## 2017-03-30 MED ORDER — PROPOFOL 10 MG/ML IV BOLUS
INTRAVENOUS | Status: DC | PRN
  Administered 2017-03-30: 180 mg via INTRAVENOUS

## 2017-03-30 MED ORDER — FENTANYL CITRATE (PF) 100 MCG/2ML IJ SOLN
INTRAMUSCULAR | Status: DC | PRN
  Administered 2017-03-30 (×2): 50 ug via INTRAVENOUS

## 2017-03-30 SURGICAL SUPPLY — 25 items
ABLATOR ENDOMETRIAL BIPOLAR (ABLATOR) ×2 IMPLANT
CANISTER SUCT 3000ML PPV (MISCELLANEOUS) ×2 IMPLANT
CATH ROBINSON RED A/P 16FR (CATHETERS) ×2 IMPLANT
CLOTH BEACON ORANGE TIMEOUT ST (SAFETY) ×2 IMPLANT
COUNTER NEEDLE 1200 MAGNETIC (NEEDLE) ×2 IMPLANT
DILATOR CANAL MILEX (MISCELLANEOUS) IMPLANT
ELECT REM PT RETURN 9FT ADLT (ELECTROSURGICAL)
ELECTRODE REM PT RTRN 9FT ADLT (ELECTROSURGICAL) IMPLANT
GAUZE VASELINE 1X8 (GAUZE/BANDAGES/DRESSINGS) IMPLANT
GLOVE BIO SURGEON STRL SZ 6.5 (GLOVE) ×2 IMPLANT
GLOVE BIOGEL PI IND STRL 6.5 (GLOVE) ×1 IMPLANT
GLOVE BIOGEL PI IND STRL 7.0 (GLOVE) ×1 IMPLANT
GLOVE BIOGEL PI INDICATOR 6.5 (GLOVE) ×1
GLOVE BIOGEL PI INDICATOR 7.0 (GLOVE) ×1
GOWN STRL REUS W/ TWL XL LVL3 (GOWN DISPOSABLE) ×1 IMPLANT
GOWN STRL REUS W/TWL XL LVL3 (GOWN DISPOSABLE) ×1
IV NS IRRIG 3000ML ARTHROMATIC (IV SOLUTION) ×2 IMPLANT
KIT RM TURNOVER CYSTO AR (KITS) ×2 IMPLANT
NS IRRIG 500ML POUR BTL (IV SOLUTION) IMPLANT
PACK VAGINAL MINOR WOMEN LF (CUSTOM PROCEDURE TRAY) ×2 IMPLANT
PAD OB MATERNITY 4.3X12.25 (PERSONAL CARE ITEMS) ×2 IMPLANT
TOWEL OR 17X24 6PK STRL BLUE (TOWEL DISPOSABLE) ×4 IMPLANT
TUBING AQUILEX INFLOW (TUBING) ×2 IMPLANT
TUBING AQUILEX OUTFLOW (TUBING) ×2 IMPLANT
WATER STERILE IRR 500ML POUR (IV SOLUTION) ×2 IMPLANT

## 2017-03-30 NOTE — Op Note (Signed)
PREOPERATIVE DIAGNOSES: 1. AUB  POSTOPERATIVE DIAGNOSES: Same  PROCEDURE PERFORMED: Dilation, curretage, hysteroscopy, novasure endometrial ablation  SURGEON: Dr. Lucillie Garfinkel  ANESTHESIA: IV sedation  ESTIMATED BLOOD LOSS: 10cc.  FLUID DEFICIT: 062BJ  COMPLICATIONS: None  TUBES: None.  DRAINS: None  PATHOLOGY: Endometrial curretings  FINDINGS: On exam, under anesthesia, normal appearing vulva and vagina, 9 week sized uterus  Operative findings demonstrated atrophic endometrium. B/l ostia visualized  Procedure: The patient was taken to the operating room where she was properly prepped and draped in sterile manner under general anesthesia. After bimanual examination, the cervix was exposed with a vaginal speculum and the anterior lip of the cervix grasped with a tenaculum after 2cc lidocaine was injected.  The endocervical canal was then progressively dilated to 57mm. The hysteroscope was then introduced into the uterine cavity using sterile saline solution as a distending media and with attached video camera. The endometrial cavity was distended with fluids and the cavity with the b/l ostia were visualized. Atrophic endometrium noted. Sharp curretage was then performed. Novasure endometrial ablation was performed per package instructions in the typical manner and sounding uterus and estimating CL to 4cm, cavity length of 5cm and cavity width of 4cm. The scope was reintroduced and uterus noted to be completed ablated. The hysteroscope removed from the cavity. The sponge and lap counts were correct times 2 at this time. The patient's procedure was terminated. We then awakened her. She was sent to the Recovery Room in good condition.    Lucillie Garfinkel MD

## 2017-03-30 NOTE — Anesthesia Procedure Notes (Signed)
Procedure Name: LMA Insertion Date/Time: 03/30/2017 7:24 AM Performed by: Oleta Mouse Pre-anesthesia Checklist: Patient identified, Emergency Drugs available, Suction available and Patient being monitored Patient Re-evaluated:Patient Re-evaluated prior to induction Oxygen Delivery Method: Circle system utilized Preoxygenation: Pre-oxygenation with 100% oxygen Induction Type: IV induction Ventilation: Mask ventilation without difficulty LMA: LMA inserted LMA Size: 4.0 Number of attempts: 1 Airway Equipment and Method: Bite block Placement Confirmation: positive ETCO2 Tube secured with: Tape Dental Injury: Teeth and Oropharynx as per pre-operative assessment

## 2017-03-30 NOTE — Anesthesia Preprocedure Evaluation (Signed)
Anesthesia Evaluation  Patient identified by MRN, date of birth, ID band Patient awake    Reviewed: Allergy & Precautions, NPO status , Patient's Chart, lab work & pertinent test results  History of Anesthesia Complications Negative for: history of anesthetic complications  Airway Mallampati: III  TM Distance: <3 FB Neck ROM: Full    Dental  (+) Teeth Intact, Chipped,    Pulmonary neg pulmonary ROS,    breath sounds clear to auscultation       Cardiovascular (-) hypertension(-) angina(-) Past MI and (-) CHF + Valvular Problems/Murmurs MVP and MR  Rhythm:Regular     Neuro/Psych Seizures -, Well Controlled,  PSYCHIATRIC DISORDERS Depression    GI/Hepatic negative GI ROS, Neg liver ROS,   Endo/Other  negative endocrine ROS  Renal/GU negative Renal ROS     Musculoskeletal   Abdominal   Peds  Hematology negative hematology ROS (+)   Anesthesia Other Findings ADHD, Nl EF, MVP with mild MR  Reproductive/Obstetrics                             Anesthesia Physical Anesthesia Plan  ASA: II  Anesthesia Plan: General   Post-op Pain Management:    Induction: Intravenous  PONV Risk Score and Plan: 3 and Ondansetron, Dexamethasone, Propofol infusion and Treatment may vary due to age or medical condition  Airway Management Planned: LMA  Additional Equipment: None  Intra-op Plan:   Post-operative Plan: Extubation in OR  Informed Consent: I have reviewed the patients History and Physical, chart, labs and discussed the procedure including the risks, benefits and alternatives for the proposed anesthesia with the patient or authorized representative who has indicated his/her understanding and acceptance.   Dental advisory given  Plan Discussed with: CRNA and Surgeon  Anesthesia Plan Comments:         Anesthesia Quick Evaluation

## 2017-03-30 NOTE — Discharge Instructions (Signed)
DISCHARGE INSTRUCTIONS: HYSTEROSCOPY / ENDOMETRIAL ABLATION The following instructions have been prepared to help you care for yourself upon your return home.   May take Ibuprofen after 2 pm today  May take stool softner while taking narcotic pain medication to prevent constipation.  Drink plenty of water.  Personal hygiene:  Use sanitary pads for vaginal drainage, not tampons.  Shower the day after your procedure.  NO tub baths, pools or Jacuzzis for 2-3 weeks.  Wipe front to back after using the bathroom.  Activity and limitations:  Do NOT drive or operate any equipment for 24 hours. The effects of anesthesia are still present and drowsiness may result.  Do NOT rest in bed all day.  Walking is encouraged.  Walk up and down stairs slowly.  You may resume your normal activity in one to two days or as indicated by your physician. Sexual activity: NO intercourse for at least 2 weeks after the procedure, or as indicated by your Doctor.  Diet: Eat a light meal as desired this evening. You may resume your usual diet tomorrow.  Return to Work: You may resume your work activities in one to two days or as indicated by Marine scientist.  What to expect after your surgery: Expect to have vaginal bleeding/discharge for 2-3 days and spotting for up to 10 days. It is not unusual to have soreness for up to 1-2 weeks. You may have a slight burning sensation when you urinate for the first day. Mild cramps may continue for a couple of days. You may have a regular period in 2-6 weeks.  Call your doctor for any of the following:  Excessive vaginal bleeding or clotting, saturating and changing one pad every hour.  Inability to urinate 6 hours after discharge from hospital.  Pain not relieved by pain medication.  Fever of 100.4 F or greater.  Unusual vaginal discharge or odor.  Post Anesthesia Home Care Instructions  Activity: Get plenty of rest for the remainder of the day. A  responsible individual must stay with you for 24 hours following the procedure.  For the next 24 hours, DO NOT: -Drive a car -Paediatric nurse -Drink alcoholic beverages -Take any medication unless instructed by your physician -Make any legal decisions or sign important papers.  Meals: Start with liquid foods such as gelatin or soup. Progress to regular foods as tolerated. Avoid greasy, spicy, heavy foods. If nausea and/or vomiting occur, drink only clear liquids until the nausea and/or vomiting subsides. Call your physician if vomiting continues.  Special Instructions/Symptoms: Your throat may feel dry or sore from the anesthesia or the breathing tube placed in your throat during surgery. If this causes discomfort, gargle with warm salt water. The discomfort should disappear within 24 hours.  If you had a scopolamine patch placed behind your ear for the management of post- operative nausea and/or vomiting:  1. The medication in the patch is effective for 72 hours, after which it should be removed.  Wrap patch in a tissue and discard in the trash. Wash hands thoroughly with soap and water. 2. You may remove the patch earlier than 72 hours if you experience unpleasant side effects which may include dry mouth, dizziness or visual disturbances. 3. Avoid touching the patch. Wash your hands with soap and water after contact with the patch.

## 2017-03-30 NOTE — Transfer of Care (Signed)
  Last Vitals:  Vitals:   03/30/17 0529 03/30/17 0806  BP: 113/67 109/71  Pulse: 89 67  Resp: 16 12  Temp: 36.6 C 36.5 C  SpO2: 99% 100%    Last Pain:  Vitals:   03/30/17 0529  TempSrc: Oral      Patients Stated Pain Goal: 4 (03/30/17 0541)  Immediate Anesthesia Transfer of Care Note  Patient: Krista Meyer  Procedure(s) Performed: Procedure(s) (LRB): DILATATION & CURETTAGE/HYSTEROSCOPY WITH NOVASURE ABLATION (N/A)  Patient Location: PACU  Anesthesia Type: General  Level of Consciousness: awake, alert  and oriented  Airway & Oxygen Therapy: Patient Spontanous Breathing and Patient connected to nasal cannula oxygen  Post-op Assessment: Report given to PACU RN and Post -op Vital signs reviewed and stable  Post vital signs: Reviewed and stable  Complications: No apparent anesthesia complications

## 2017-03-30 NOTE — Brief Op Note (Signed)
03/30/2017  7:58 AM  PATIENT:  Krista Meyer  46 y.o. female  PRE-OPERATIVE DIAGNOSIS:  POLYPS, POSSIBLE FIBROIDS, MENORRHAGIA  POST-OPERATIVE DIAGNOSIS:  MENORRHAGIA  PROCEDURE:  Procedure(s): DILATATION & CURETTAGE/HYSTEROSCOPY WITH NOVASURE ABLATION, POLYPECTOMY, POSSIBLE MYOSURE RESECTION OF FIBROIDS (N/A)  SURGEON:  Surgeon(s) and Role:    * Vipul Cafarelli, Colin Benton, MD - Primary  PHYSICIAN ASSISTANT:   ASSISTANTS: none   ANESTHESIA:   local and IV sedation  EBL:  10 mL   BLOOD ADMINISTERED:none  DRAINS: none   LOCAL MEDICATIONS USED:  LIDOCAINE  and Amount: 2 ml  SPECIMEN:  Source of Specimen:  endometrial curettings  DISPOSITION OF SPECIMEN:  PATHOLOGY  COUNTS:  YES  TOURNIQUET:  * No tourniquets in log *  DICTATION: .Note written in EPIC  PLAN OF CARE: Discharge to home after PACU  PATIENT DISPOSITION:  PACU - hemodynamically stable.   Delay start of Pharmacological VTE agent (>24hrs) due to surgical blood loss or risk of bleeding: not applicable

## 2017-03-30 NOTE — Anesthesia Postprocedure Evaluation (Signed)
Anesthesia Post Note  Patient: Krista Meyer  Procedure(s) Performed: DILATATION & CURETTAGE/HYSTEROSCOPY WITH NOVASURE ABLATION (N/A Vagina )     Patient location during evaluation: PACU Anesthesia Type: General Level of consciousness: awake and alert Pain management: pain level controlled Vital Signs Assessment: post-procedure vital signs reviewed and stable Respiratory status: spontaneous breathing, nonlabored ventilation, respiratory function stable and patient connected to nasal cannula oxygen Cardiovascular status: blood pressure returned to baseline and stable Postop Assessment: no apparent nausea or vomiting Anesthetic complications: no    Last Vitals:  Vitals:   03/30/17 0905 03/30/17 0915  BP:    Pulse: 68 70  Resp: 19 19  Temp:    SpO2: 96% 95%    Last Pain:  Vitals:   03/30/17 0945  TempSrc:   PainSc: 3                  Elih Mooney

## 2017-04-02 ENCOUNTER — Encounter (HOSPITAL_BASED_OUTPATIENT_CLINIC_OR_DEPARTMENT_OTHER): Payer: Self-pay | Admitting: Obstetrics and Gynecology

## 2017-04-26 ENCOUNTER — Other Ambulatory Visit: Payer: Self-pay | Admitting: Neurology

## 2017-05-04 ENCOUNTER — Other Ambulatory Visit: Payer: Self-pay

## 2017-05-04 ENCOUNTER — Telehealth: Payer: Self-pay | Admitting: Neurology

## 2017-05-04 MED ORDER — LEVETIRACETAM ER 500 MG PO TB24: 500.0000 mg | ORAL_TABLET | Freq: Two times a day (BID) | ORAL | 1 refills | Status: DC

## 2017-05-04 NOTE — Telephone Encounter (Signed)
Called and spoke with Pt, she is aware she must keep appt. Sent in refill to [facility]

## 2017-05-04 NOTE — Telephone Encounter (Signed)
Patient has a 1 year follow up with Dr. Tomi Likens in March. She was needing to see can she have her medication refilled until that appointment? That is his next available. Please Advise. Thanks

## 2017-05-14 ENCOUNTER — Ambulatory Visit (HOSPITAL_COMMUNITY): Payer: 59 | Admitting: Psychiatry

## 2017-08-14 ENCOUNTER — Encounter: Payer: Self-pay | Admitting: Neurology

## 2017-08-14 ENCOUNTER — Ambulatory Visit (INDEPENDENT_AMBULATORY_CARE_PROVIDER_SITE_OTHER): Payer: 59 | Admitting: Neurology

## 2017-08-14 VITALS — BP 94/64 | HR 78 | Ht 63.0 in | Wt 148.2 lb

## 2017-08-14 DIAGNOSIS — R4781 Slurred speech: Secondary | ICD-10-CM

## 2017-08-14 DIAGNOSIS — G40209 Localization-related (focal) (partial) symptomatic epilepsy and epileptic syndromes with complex partial seizures, not intractable, without status epilepticus: Secondary | ICD-10-CM | POA: Diagnosis not present

## 2017-08-14 MED ORDER — LEVETIRACETAM ER 500 MG PO TB24: 500.0000 mg | ORAL_TABLET | Freq: Two times a day (BID) | ORAL | 3 refills | Status: DC

## 2017-08-14 NOTE — Progress Notes (Signed)
NEUROLOGY FOLLOW UP OFFICE NOTE  [name] [id number]  HISTORY OF PRESENT ILLNESS: Krista Meyer is a 47 y.o. female with ADHD, restless leg syndrome and seizure disorder who follows up for localization-related seizure disorder    UPDATE: She takes Keppra XR 500mg  twice daily.   Last seizure:  College.  She still has occasional "deja vu". She reports that she sometimes slurs her words.  There is no associated facial droop, unilateral numbness/weakness or headache.  It usually occurs when she is tired, in the morning or at night.  Nobody else has noticed it.  It has been occurring for the past year.   HISTORY: Seizures:  She began experiencing symptoms in highschool, described as a feeling of "dj vu" and a "weird "feeling". It was really brief and she was told that her face turns red. There was no altered consciousness or alertness. At that time, she had a cardiac workup which was negative. Her symptoms were thought to be psychosomatic. A couple of years later, while at college, she had one of her dj vu feelings followed by a generalized tonic-clonic seizure. This was preceded by a long night awake and feeling exhausted.  She was initially started on Dilantin, but she became toxic. She was subsequently switched to Tegretol. It is been effective, but it gave her a headache. About 3 or 4 years ago, she was switched to Echo and has been tolerating it very well. She has not had any further generalized tonic-clonic seizures or typical dj vu spells since college. Occasionally, she has a very brief indescribable dj vu feeling when she is in bed while falling asleep.  She has had routine EEGs which were both normal and sometimes abnormal.  She was told abnormal EEGs revealed discharges in the right temporal lobe.  MRI of brain without contrast, performed on 01/16/08, was unremarkable.  No family history of seizures.   Prior medications include Dilantin (toxic), Tegretol  (headache), phenobarb (side effect), Lamictal (not remember why it was stopped).  PAST MEDICAL HISTORY: Past Medical History:  Diagnosis Date  . Abnormal uterine bleeding (AUB)   . ADHD   . History of exercise stress test 02-17-2009 by dr Irish Lack   non-diagnostic given baseline ECG, abnormality. No sx. Doubt ischemic  . Intermittent palpitations   . Localization-related focal epilepsy with complex partial seizures [facility]) dx age 66--- neurologist-- dr Tomi Likens ([place])   per lov note -- stable/  per pt last seizure age 43, controlled w/ medication  . Major depression   . Mild mitral valve prolapse   . Pulmonary nodules 07/31/2016  . Restless leg syndrome     MEDICATIONS: Current Outpatient Medications on File Prior to Visit  Medication Sig Dispense Refill  . acetaminophen (TYLENOL) 325 MG tablet Take 325 mg by mouth every 6 (six) hours as needed for pain (prn).    Marland Kitchen amoxicillin-clavulanate (AUGMENTIN) 875-125 MG tablet     . docusate sodium (COLACE) 100 MG capsule Take 1 capsule (100 mg total) by mouth 2 (two) times daily. 30 capsule 2  . ipratropium (ATROVENT) 0.06 % nasal spray     . oxyCODONE (OXY IR/ROXICODONE) 5 MG immediate release tablet Take 1 tablet (5 mg total) by mouth every 4 (four) hours as needed for severe pain. 15 tablet 0  . Polyethylene Glycol 3350 (MIRALAX PO) Take by mouth as needed.    . venlafaxine XR (EFFEXOR XR) 75 MG 24 hr capsule Take 1 capsule (75 mg total) by mouth daily with  breakfast. 90 capsule 1   No current facility-administered medications on file prior to visit.     ALLERGIES: Allergies  Allergen Reactions  . Betadine [Povidone Iodine] Other (See Comments)    "burns skin" or anything that had iodine in it  . Ivp Dye [Iodinated Diagnostic Agents] Other (See Comments)    Per pt does ok when pre-medicated  . Shellfish Allergy Nausea And Vomiting    FAMILY HISTORY: Family History  Problem Relation Age of Onset  . Diabetes insipidus Paternal Uncle    . Stroke Paternal Grandfather   . Diabetes insipidus Maternal Aunt   . Cancer Father     SOCIAL HISTORY: Social History   Socioeconomic History  . Marital status: Married    Spouse name: Not on file  . Number of children: Not on file  . Years of education: Not on file  . Highest education level: Not on file  Social Needs  . Financial resource strain: Not on file  . Food insecurity - worry: Not on file  . Food insecurity - inability: Not on file  . Transportation needs - medical: Not on file  . Transportation needs - non-medical: Not on file  Occupational History  . Not on file  Tobacco Use  . Smoking status: Never Smoker  . Smokeless tobacco: Never Used  Substance and Sexual Activity  . Alcohol use: No  . Drug use: No  . Sexual activity: Not on file  Other Topics Concern  . Not on file  Social History Narrative  . Not on file    REVIEW OF SYSTEMS: Constitutional: No fevers, chills, or sweats, no generalized fatigue, change in appetite Eyes: No visual changes, double vision, eye pain Ear, nose and throat: No hearing loss, ear pain, nasal congestion, sore throat Cardiovascular: No chest pain, palpitations Respiratory:  No shortness of breath at rest or with exertion, wheezes GastrointestinaI: No nausea, vomiting, diarrhea, abdominal pain, fecal incontinence Genitourinary:  No dysuria, urinary retention or frequency Musculoskeletal:  Back pain Integumentary: No rash, pruritus, skin lesions Neurological: as above Psychiatric: No depression, insomnia, anxiety Endocrine: No palpitations, fatigue, diaphoresis, mood swings, change in appetite, change in weight, increased thirst Hematologic/Lymphatic:  No purpura, petechiae. Allergic/Immunologic: no itchy/runny eyes, nasal congestion, recent allergic reactions, rashes  PHYSICAL EXAM: Vitals:   08/14/17 1043  BP: 94/64  Pulse: 78  SpO2: 98%   General: No acute distress.  Patient appears well-groomed.  Head:   Normocephalic/atraumatic Eyes:  Fundi examined but not visualized Neck: supple, no paraspinal tenderness, full range of motion Heart:  Regular rate and rhythm Lungs:  Clear to auscultation bilaterally Back: No paraspinal tenderness Neurological Exam: alert and oriented to person, place, and time. Attention span and concentration intact, recent and remote memory intact, fund of knowledge intact.  Speech fluent and not dysarthric, language intact.  CN II-XII intact. Bulk and tone normal, muscle strength 5/5 throughout.  Sensation to light touch  intact.  Deep tendon reflexes 2+ throughout.  Toes downgoing.  Finger to nose testing intact.  Gait normal, Romberg negative.  IMPRESSION: 1.  Localization-related epilepsy, stable 2.  Intermittent slurred speech.  Vague symptoms.  I really do not suspect TIA, migraine or seizure.  Exam is unremarkable.  No red flags regarding semiology.  I don't really suspect medication-related (such as Effexor).  I offered MRI of brain but she would like to hold off.  She will monitor for now.  PLAN: Continue Keppra XR 500mg  twice daily. Follow up in one year or  as needed.  25 minutes spent face to face with patient, over 50% spent discussing management.  Metta Clines, DO  CC:  Dr. Jacelyn Grip

## 2017-08-14 NOTE — Patient Instructions (Signed)
Keppra is refilled.  Follow up in one year or as needed.

## 2017-08-24 ENCOUNTER — Other Ambulatory Visit (HOSPITAL_COMMUNITY): Payer: Self-pay | Admitting: Psychiatry

## 2017-08-24 DIAGNOSIS — F3341 Major depressive disorder, recurrent, in partial remission: Secondary | ICD-10-CM

## 2017-09-28 ENCOUNTER — Ambulatory Visit (INDEPENDENT_AMBULATORY_CARE_PROVIDER_SITE_OTHER): Payer: 59 | Admitting: Psychiatry

## 2017-09-28 ENCOUNTER — Encounter (HOSPITAL_COMMUNITY): Payer: Self-pay | Admitting: Psychiatry

## 2017-09-28 VITALS — BP 110/68 | HR 76 | Ht 63.0 in | Wt 148.0 lb

## 2017-09-28 DIAGNOSIS — Z79899 Other long term (current) drug therapy: Secondary | ICD-10-CM

## 2017-09-28 DIAGNOSIS — F3341 Major depressive disorder, recurrent, in partial remission: Secondary | ICD-10-CM

## 2017-09-28 DIAGNOSIS — R4581 Low self-esteem: Secondary | ICD-10-CM

## 2017-09-28 DIAGNOSIS — F909 Attention-deficit hyperactivity disorder, unspecified type: Secondary | ICD-10-CM | POA: Diagnosis not present

## 2017-09-28 MED ORDER — VENLAFAXINE HCL ER 37.5 MG PO CP24: 37.5000 mg | ORAL_CAPSULE | Freq: Every day | ORAL | 0 refills | Status: DC

## 2017-09-28 NOTE — Progress Notes (Signed)
BH MD/PA/NP OP Progress Note  09/28/2017 10:24 AM Krista Meyer  MRN:  [id number]  Chief Complaint: Med check HPI: Krista Meyer reports that she does not feel any different with Effexor.  Spent time discussing what her expectations are regarding antidepressant, and she is primarily concerned about energy.  I spent time with her reviewing laboratory studies to investigate for etiologies of fatigue.  The Effexor has been very constipating for her so we agreed to taper and discontinue.  I spent time with her exploring what she means by fatigue, as she tends to appear quite energetic and attentive during our interactions.  Much of what she describes tends to be related to poor self-esteem, poor self perception of her abilities, and issues that need to be addressed in focused therapy.  I educated her about the modality of emotions focused therapy and how this can be helpful in being able to understand one's internal experiences and thought process, and she was very receptive to this.  We also agreed to proceed with psychological testing to assess for her cognition and attentive symptoms in the context of mood symptoms, to further clarify her needs.  He does not present with gross depressive symptoms, unsafe thoughts.  She does continue to struggle with chronically poor self-esteem.  Visit Diagnosis:    ICD-10-CM   1. Recurrent major depressive disorder, in partial remission (HCC) F33.41 venlafaxine XR (EFFEXOR-XR) 37.5 MG 24 hr capsule    Ambulatory referral to Psychology    Past Psychiatric History: See intake H&P for full details. Reviewed, with no updates at this time.   Past Medical History:  Past Medical History:  Diagnosis Date  . Abnormal uterine bleeding (AUB)   . ADHD   . History of exercise stress test 02-17-2009 by dr Irish Lack   non-diagnostic given baseline ECG, abnormality. No sx. Doubt ischemic  . Intermittent palpitations   . Localization-related focal epilepsy  with complex partial seizures [facility]) dx age 57--- neurologist-- dr Tomi Likens ([place])   per lov note -- stable/  per pt last seizure age 49, controlled w/ medication  . Major depression   . Mild mitral valve prolapse   . Pulmonary nodules 07/31/2016  . Restless leg syndrome     Past Surgical History:  Procedure Laterality Date  . DILITATION & CURRETTAGE/HYSTROSCOPY WITH NOVASURE ABLATION N/A 03/30/2017   Procedure: DILATATION & CURETTAGE/HYSTEROSCOPY WITH NOVASURE ABLATION;  Surgeon: Tyson Dense, MD;  Location: [facility];  Service: Gynecology;  Laterality: N/A;  . NASAL SEPTUM SURGERY  TEEN  . PARTIAL MASTECTOMY WITH NEEDLE LOCALIZATION Right 07-23-2007   dr Zella Richer  [facility]   benign  . TRANSTHORACIC ECHOCARDIOGRAM  02-17-2009   dr Irish Lack   ef 65-70%/  mild MR and mild MVP/  trivial TR/  mild PI    Family Psychiatric History: See intake H&P for full details. Reviewed, with no updates at this time.   Family History:  Family History  Problem Relation Age of Onset  . Diabetes insipidus Paternal Uncle   . Stroke Paternal Grandfather   . Diabetes insipidus Maternal Aunt   . Cancer Father     Social History:  Social History   Socioeconomic History  . Marital status: Married    Spouse name: Not on file  . Number of children: Not on file  . Years of education: Not on file  . Highest education level: Not on file  Occupational History  . Not on file  Social Needs  . Emergency planning/management officer  strain: Not on file  . Food insecurity:    Worry: Not on file    Inability: Not on file  . Transportation needs:    Medical: Not on file    Non-medical: Not on file  Tobacco Use  . Smoking status: Never Smoker  . Smokeless tobacco: Never Used  Substance and Sexual Activity  . Alcohol use: No  . Drug use: No  . Sexual activity: Not on file  Lifestyle  . Physical activity:    Days per week: Not on file    Minutes per session: Not on file  . Stress: Not on file   Relationships  . Social connections:    Talks on phone: Not on file    Gets together: Not on file    Attends religious service: Not on file    Active member of club or organization: Not on file    Attends meetings of clubs or organizations: Not on file    Relationship status: Not on file  Other Topics Concern  . Not on file  Social History Narrative  . Not on file    Allergies:  Allergies  Allergen Reactions  . Betadine [Povidone Iodine] Other (See Comments)    "burns skin" or anything that had iodine in it  . Ivp Dye [Iodinated Diagnostic Agents] Other (See Comments)    Per pt does ok when pre-medicated  . Shellfish Allergy Nausea And Vomiting    Metabolic Disorder Labs: No results found for: HGBA1C, MPG No results found for: PROLACTIN No results found for: CHOL, TRIG, HDL, CHOLHDL, VLDL, LDLCALC Lab Results  Component Value Date   TSH 0.756 07/18/2016    Therapeutic Level Labs: No results found for: LITHIUM No results found for: VALPROATE No components found for:  CBMZ  Current Medications: Current Outpatient Medications  Medication Sig Dispense Refill  . acetaminophen (TYLENOL) 325 MG tablet Take 325 mg by mouth every 6 (six) hours as needed for pain (prn).    Marland Kitchen amoxicillin-clavulanate (AUGMENTIN) 875-125 MG tablet     . docusate sodium (COLACE) 100 MG capsule Take 1 capsule (100 mg total) by mouth 2 (two) times daily. 30 capsule 2  . ipratropium (ATROVENT) 0.06 % nasal spray     . levETIRAcetam (KEPPRA XR) 500 MG 24 hr tablet Take 1 tablet (500 mg total) by mouth 2 (two) times daily. 180 tablet 3  . oxyCODONE (OXY IR/ROXICODONE) 5 MG immediate release tablet Take 1 tablet (5 mg total) by mouth every 4 (four) hours as needed for severe pain. 15 tablet 0  . Polyethylene Glycol 3350 (MIRALAX PO) Take by mouth as needed.    . venlafaxine XR (EFFEXOR-XR) 37.5 MG 24 hr capsule Take 1 capsule (37.5 mg total) by mouth daily with breakfast. Take for 2 weeks then stop 14  capsule 0   No current facility-administered medications for this visit.      Musculoskeletal: Strength & Muscle Tone: within normal limits Gait & Station: normal Patient leans: N/A  Psychiatric Specialty Exam: ROS  Blood pressure 110/68, pulse 76, height 5\' 3"  (1.6 m), weight 148 lb (67.1 kg).Body mass index is 26.22 kg/m.  General Appearance: Casual and Well Groomed  Eye Contact:  Good  Speech:  Clear and Coherent  Volume:  Normal  Mood:  feed fatigued  Affect:  Appropriate and Congruent  Thought Process:  Coherent and Descriptions of Associations: Intact  Orientation:  Full (Time, Place, and Person)  Thought Content: Logical   Suicidal Thoughts:  No  Homicidal Thoughts:  No  Memory:  Immediate;   Fair  Judgement:  Fair  Insight:  Fair  Psychomotor Activity:  Normal  Concentration:  Attention Span: Good  Recall:  Good  Fund of Knowledge: Good  Language: Good  Akathisia:  Negative  Handed:  Right  AIMS (if indicated): not done  Assets:  Communication Skills Desire for Improvement Financial Resources/Insurance Housing  ADL's:  Intact  Cognition: WNL  Sleep:  Good   Screenings:   Assessment and Plan:  Krista Meyer continues to present with vague depressive symptoms, complaints of fatigue, complaints of lack of focus.  Much of what she describes are related to her internal perceptions of her experience, but functionally she does not present with any impairment in her personal, professional or social life.  She would very much like to proceed with focused therapy and has significant apprehensions about medications given the side effects.  We agreed to taper and discontinue Effexor and we will proceed with testing for her metabolism of antidepressants.  We also agreed to proceed with testing for further clarification of her mental health needs, and whether or not ADHD is part of her experience.  We will also get laboratory studies as ordered to assess for  medical etiologies for fatigue.  1. Recurrent major depressive disorder, in partial remission ([place])     Status of current problems: unchanged  Labs Ordered: Orders Placed This Encounter  Procedures  . Ambulatory referral to Psychology    Referral Priority:   Routine    Referral Type:   Psychiatric    Referral Reason:   Specialty Services Required    Referred to Provider:   Terrace Arabia, PhD    Requested Specialty:   Psychology    Number of Visits Requested:   1    Labs Reviewed: n/a  Collateral Obtained/Records Reviewed: n/a  Plan:  Taper Effexor and discontinue Laboratory studies as ordered Psychology testing for ADHD, mood symptoms Return to clinic in 8 weeks  I spent 20 minutes with the patient in direct face-to-face clinical care.  Greater than 50% of this time was spent in counseling and coordination of care with the patient.    Aundra Dubin, MD 09/28/2017, 10:24 AM

## 2017-10-05 LAB — CBC WITH DIFFERENTIAL/PLATELET
BASOS: 1 %
Basophils Absolute: 0 10*3/uL (ref 0.0–0.2)
EOS (ABSOLUTE): 0.1 10*3/uL (ref 0.0–0.4)
Eos: 2 %
HEMOGLOBIN: 13.5 g/dL (ref 11.1–15.9)
Hematocrit: 40.5 % (ref 34.0–46.6)
IMMATURE GRANS (ABS): 0 10*3/uL (ref 0.0–0.1)
IMMATURE GRANULOCYTES: 0 %
LYMPHS: 33 %
Lymphocytes Absolute: 1.8 10*3/uL (ref 0.7–3.1)
MCH: 30.8 pg (ref 26.6–33.0)
MCHC: 33.3 g/dL (ref 31.5–35.7)
MCV: 93 fL (ref 79–97)
MONOCYTES: 9 %
Monocytes Absolute: 0.5 10*3/uL (ref 0.1–0.9)
NEUTROS ABS: 3 10*3/uL (ref 1.4–7.0)
Neutrophils: 55 %
PLATELETS: 279 10*3/uL (ref 150–379)
RBC: 4.38 x10E6/uL (ref 3.77–5.28)
RDW: 12.9 % (ref 12.3–15.4)
WBC: 5.5 10*3/uL (ref 3.4–10.8)

## 2017-10-05 LAB — COMPREHENSIVE METABOLIC PANEL
A/G RATIO: 1.5 (ref 1.2–2.2)
ALT: 8 IU/L (ref 0–32)
AST: 20 IU/L (ref 0–40)
Albumin: 4.7 g/dL (ref 3.5–5.5)
Alkaline Phosphatase: 53 IU/L (ref 39–117)
BUN/Creatinine Ratio: 17 (ref 9–23)
BUN: 12 mg/dL (ref 6–24)
Bilirubin Total: 0.4 mg/dL (ref 0.0–1.2)
CALCIUM: 9.6 mg/dL (ref 8.7–10.2)
CO2: 23 mmol/L (ref 20–29)
CREATININE: 0.7 mg/dL (ref 0.57–1.00)
Chloride: 101 mmol/L (ref 96–106)
GFR, EST AFRICAN AMERICAN: 120 mL/min/{1.73_m2} (ref >59)
GFR, EST NON AFRICAN AMERICAN: 104 mL/min/{1.73_m2} (ref >59)
GLUCOSE: 70 mg/dL (ref 65–99)
Globulin, Total: 3.1 g/dL (ref 1.5–4.5)
POTASSIUM: 4.7 mmol/L (ref 3.5–5.2)
Sodium: 140 mmol/L (ref 134–144)
TOTAL PROTEIN: 7.8 g/dL (ref 6.0–8.5)

## 2017-10-05 LAB — VITAMIN D 1,25 DIHYDROXY
VITAMIN D3 1, 25 (OH): 52 pg/mL
Vitamin D 1, 25 (OH)2 Total: 54 pg/mL

## 2017-10-05 LAB — TSH+FREE T4
Free T4: 1.18 ng/dL (ref 0.82–1.77)
TSH: 1.02 u[IU]/mL (ref 0.450–4.500)

## 2017-10-05 LAB — B12 AND FOLATE PANEL
Folate: 15.3 ng/mL (ref >3.0)
Vitamin B-12: 226 pg/mL — ABNORMAL LOW (ref 232–1245)

## 2018-08-01 ENCOUNTER — Other Ambulatory Visit: Payer: Self-pay | Admitting: Neurology

## 2018-09-18 IMAGING — CT CT HEART SCORING
2 series · 16 of 20 positions shown, 18 images · non-contrast
Comparison: No priors.

EXAM:
OVER-READ INTERPRETATION  CT CHEST

The following report is an over-read performed by radiologist Dr.
over-read does not include interpretation of cardiac or coronary
anatomy or pathology. The calcium score interpretation by the
cardiologist is attached.
CLINICAL DATA: Risk stratification
Coronary Calcium Score
MEDICATIONS:
None.
TECHNIQUE: The patient was scanned on a Siemens Somatom 64 slice scanner. Axial
non-contrast 3 mm slices were carried out through the heart. The
data set was analyzed on a dedicated work station and scored using
the Agatson method.

[Series 2: casc 3.0 i36f 2 bestdiast 67 % · axial · 0.31mm/px · z∈[-233,-128]mm · 8 of 46 slices shown, 10 images]
[im 6/46  vessel]
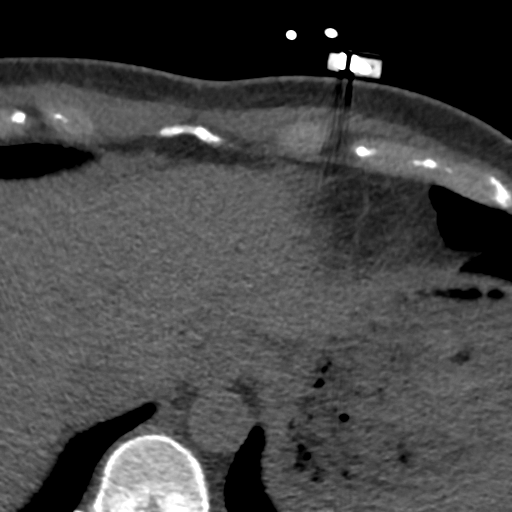
[im 6/46  lung]
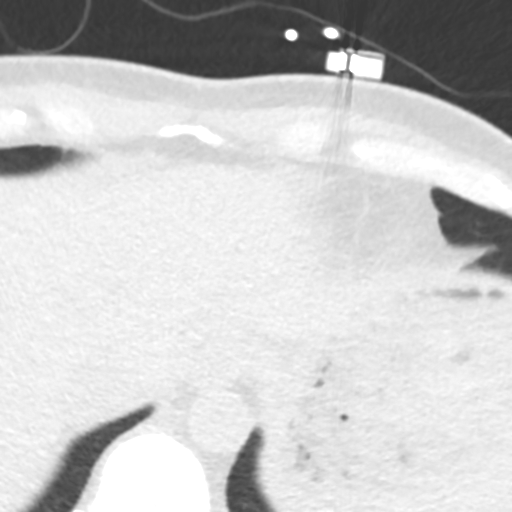
[im 11/46  vessel]
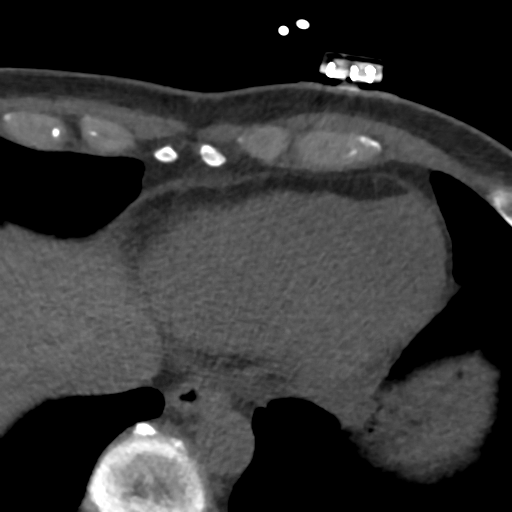
[im 16/46  vessel]
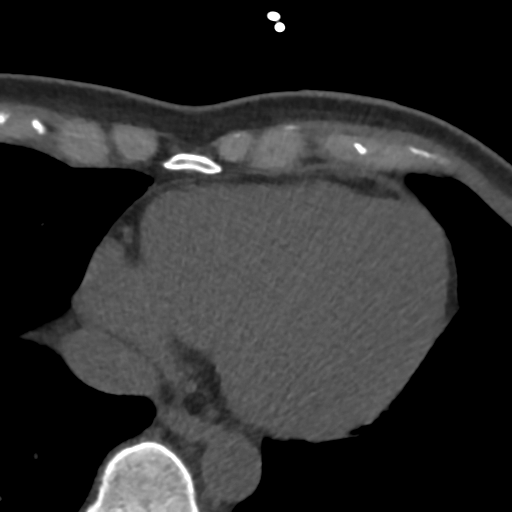
[im 21/46  vessel]
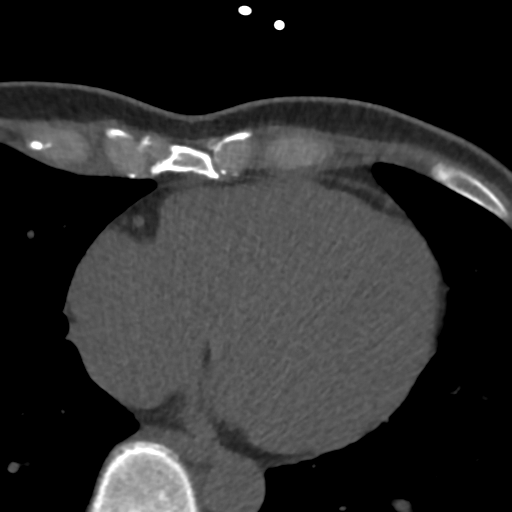
[im 26/46  vessel]
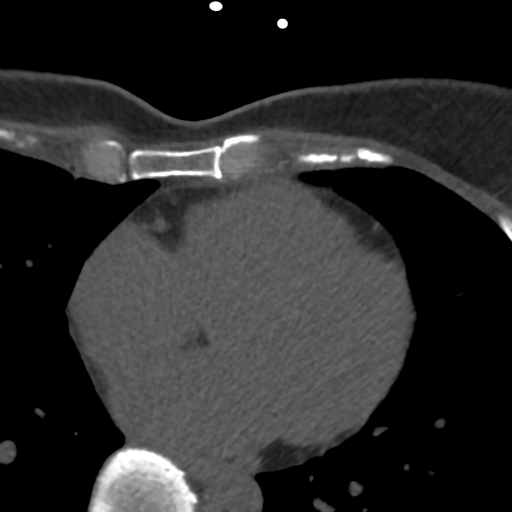
[im 26/46  lung]
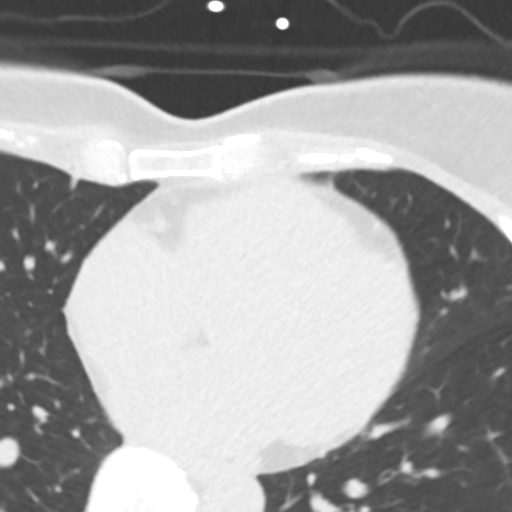
[im 31/46  vessel]
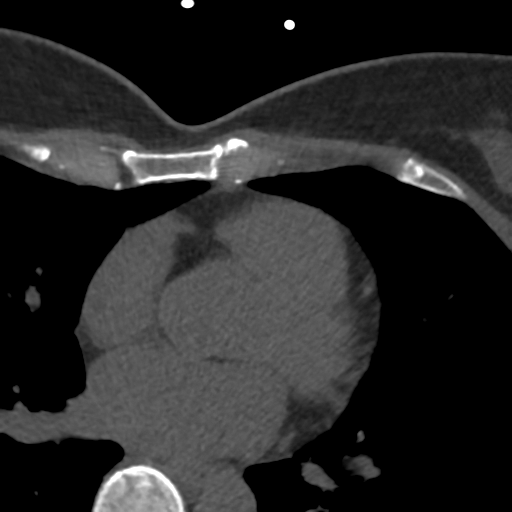
[im 36/46  vessel]
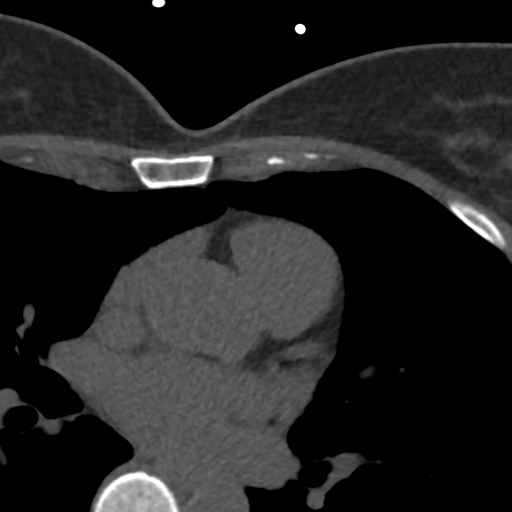
[im 41/46  vessel]
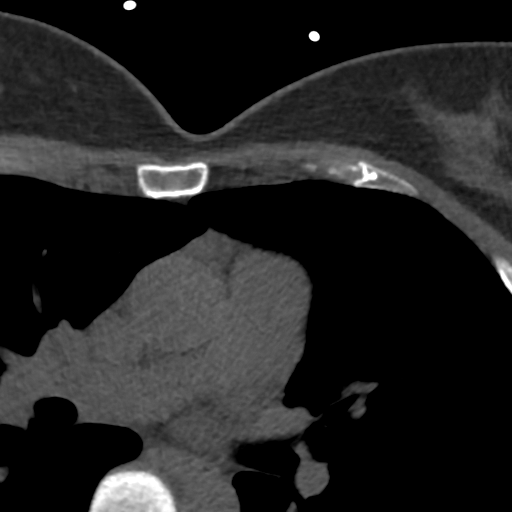

[Series 4: lung st 67 % · axial · 0.61mm/px · z∈[-233,-128]mm · 8 of 46 slices shown]
[im 6/46  lung]
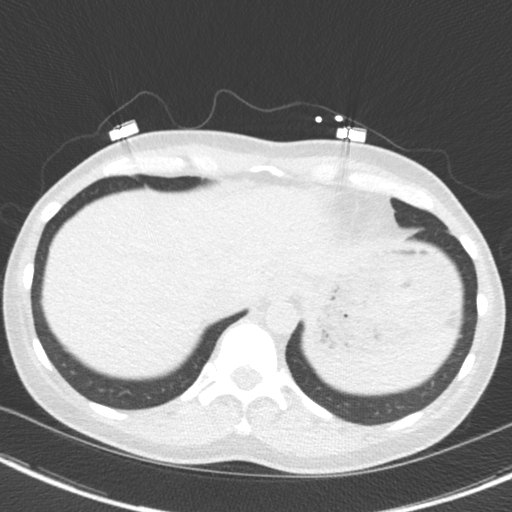
[im 11/46  lung]
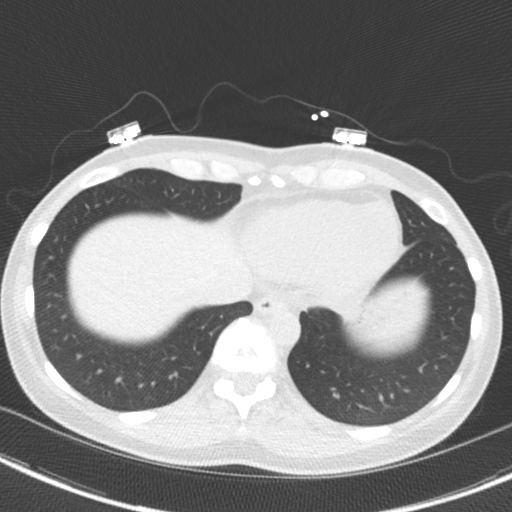
[im 16/46  lung]
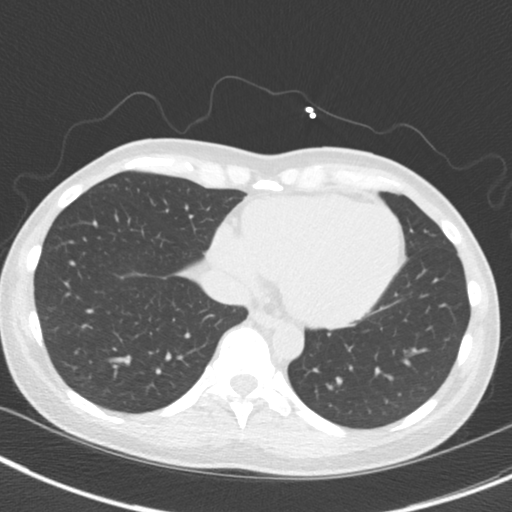
[im 21/46  lung]
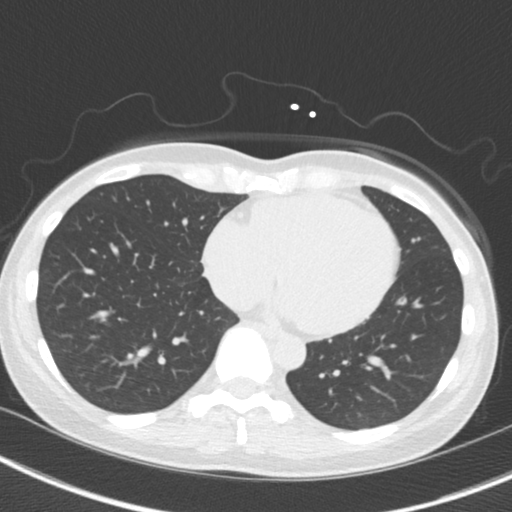
[im 26/46  lung]
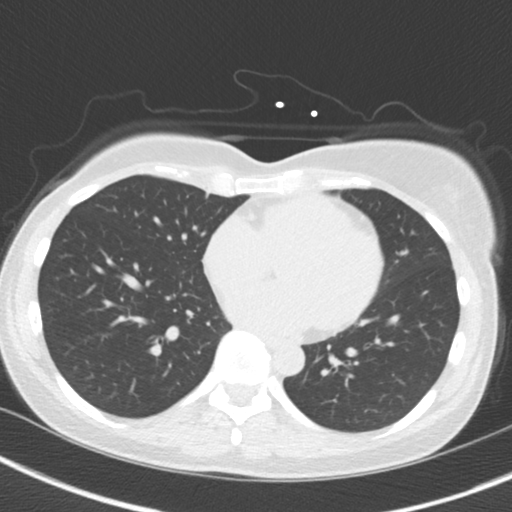
[im 31/46  lung]
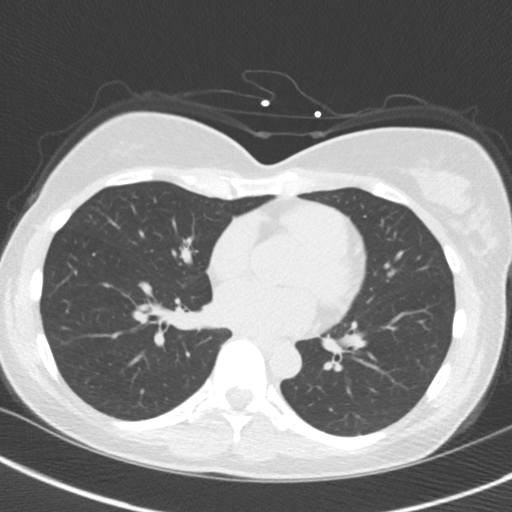
[im 36/46  lung]
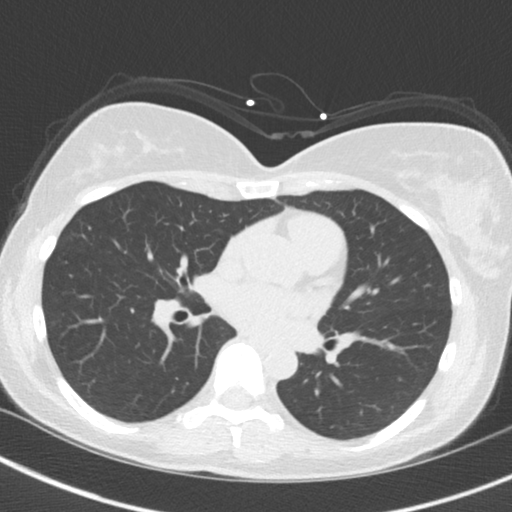
[im 41/46  lung]
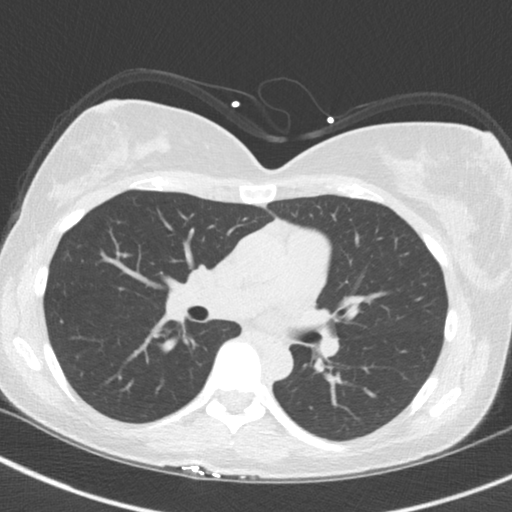

[16 of 20 positions shown; findings below may reference images not displayed]

FINDINGS: 4 mm ground-glass attenuation nodule in the right middle lobe (image
13 of series 3) is nonspecific, but statistically likely benign. The
other 2-3 mm nodules are also noted in the subpleural aspect of the
lingula, also favored to be benign. No other larger more suspicious
appearing pulmonary nodules or masses are noted within the visualize
lungs. In the visualized thorax there is no acute consolidative
airspace disease, no pleural effusion, no pneumothorax and no
lymphadenopathy. Visualized portions of the upper abdomen are
unremarkable. There are no aggressive appearing lytic or blastic
lesions noted in the visualized portions of the skeleton.
IMPRESSION: 1. Multiple tiny pulmonary nodules in the periphery of the lungs, as
above, nonspecific but statistically likely benign. No follow-up
needed if patient is low-risk (and has no known or suspected primary
neoplasm). Non-contrast chest CT can be considered in 12 months if
patient is high-risk. This recommendation follows the consensus
statement: Guidelines for Management of Incidental Pulmonary Nodules
Detected on CT Images: From the [HOSPITAL] 2106; Radiology
2106; [DATE].
FINDINGS: Non-cardiac: See separate report from [REDACTED].

Ascending Aorta:  Normal size, no calcifications.

Pericardium: Normal.

Coronary arteries:  Normal origin.
IMPRESSION: Coronary calcium score of 0. This was 0 percentile for age and sex
matched control.

Mariloo Mantri

## 2019-09-05 ENCOUNTER — Ambulatory Visit: Payer: Self-pay | Attending: Internal Medicine

## 2019-09-05 DIAGNOSIS — Z23 Encounter for immunization: Secondary | ICD-10-CM

## 2019-09-05 NOTE — Progress Notes (Signed)
   Covid-19 Vaccination Clinic  Name:  Krista Meyer    MRN: [id number] DOB: 1970-06-19  09/05/2019  [name] was observed post Covid-19 immunization for 15 minutes without incident. She was provided with Vaccine Information Sheet and instruction to access the V-Safe system.   [name] was instructed to call 911 with any severe reactions post vaccine: Marland Kitchen Difficulty breathing  . Swelling of face and throat  . A fast heartbeat  . A bad rash all over body  . Dizziness and weakness   Immunizations Administered    Name Date Dose VIS Date Route   Pfizer COVID-19 Vaccine 09/05/2019  9:12 AM 0.3 mL 05/09/2019 Intramuscular   Manufacturer: [place]   Lot: [id number]   [place]: [id number]

## 2019-10-01 ENCOUNTER — Ambulatory Visit: Payer: Self-pay | Attending: Internal Medicine

## 2019-10-01 DIAGNOSIS — Z23 Encounter for immunization: Secondary | ICD-10-CM

## 2019-10-01 NOTE — Progress Notes (Signed)
   Covid-19 Vaccination Clinic  Name:  Krista Meyer    MRN: [id number] DOB: 1971/05/18  10/01/2019  [name] was observed post Covid-19 immunization for 15 minutes without incident. She was provided with Vaccine Information Sheet and instruction to access the V-Safe system.   [name] was instructed to call 911 with any severe reactions post vaccine: Marland Kitchen Difficulty breathing  . Swelling of face and throat  . A fast heartbeat  . A bad rash all over body  . Dizziness and weakness   Immunizations Administered    Name Date Dose VIS Date Route   Pfizer COVID-19 Vaccine 10/01/2019  9:00 AM 0.3 mL 07/23/2018 Intramuscular   Manufacturer: [place]   Lot: [id number]   [place]: [id number]

## 2019-10-07 ENCOUNTER — Emergency Department
Admission: EM | Admit: 2019-10-07 | Discharge: 2019-10-07 | Disposition: A | Discharge status: Home / Self Care | Payer: Managed Care, Other (non HMO) | Attending: Emergency Medicine | Admitting: Emergency Medicine

## 2019-10-07 ENCOUNTER — Other Ambulatory Visit: Payer: Self-pay

## 2019-10-07 ENCOUNTER — Emergency Department: Payer: Managed Care, Other (non HMO)

## 2019-10-07 DIAGNOSIS — D259 Leiomyoma of uterus, unspecified: Secondary | ICD-10-CM | POA: Diagnosis not present

## 2019-10-07 DIAGNOSIS — R1031 Right lower quadrant pain: Secondary | ICD-10-CM | POA: Diagnosis present

## 2019-10-07 DIAGNOSIS — R1030 Lower abdominal pain, unspecified: Secondary | ICD-10-CM

## 2019-10-07 LAB — COMPREHENSIVE METABOLIC PANEL
ALT: 10 U/L (ref 0–44)
AST: 14 U/L — ABNORMAL LOW (ref 15–41)
Albumin: 4.4 g/dL (ref 3.5–5.0)
Alkaline Phosphatase: 47 U/L (ref 38–126)
Anion gap: 6 (ref 5–15)
BUN: 8 mg/dL (ref 6–20)
CO2: 29 mmol/L (ref 22–32)
Calcium: 9.2 mg/dL (ref 8.9–10.3)
Chloride: 104 mmol/L (ref 98–111)
Creatinine, Ser: 0.6 mg/dL (ref 0.44–1.00)
GFR calc Af Amer: >60 mL/min (ref >60)
GFR calc non Af Amer: >60 mL/min (ref >60)
Glucose, Bld: 99 mg/dL (ref 70–99)
Potassium: 4.3 mmol/L (ref 3.5–5.1)
Sodium: 139 mmol/L (ref 135–145)
Total Bilirubin: 0.9 mg/dL (ref 0.3–1.2)
Total Protein: 7.7 g/dL (ref 6.5–8.1)

## 2019-10-07 LAB — CBC
HCT: 37.6 % (ref 36.0–46.0)
Hemoglobin: 13.3 g/dL (ref 12.0–15.0)
MCH: 31.9 pg (ref 26.0–34.0)
MCHC: 35.4 g/dL (ref 30.0–36.0)
MCV: 90.2 fL (ref 80.0–100.0)
Platelets: 252 10*3/uL (ref 150–400)
RBC: 4.17 MIL/uL (ref 3.87–5.11)
RDW: 11.7 % (ref 11.5–15.5)
WBC: 6.2 10*3/uL (ref 4.0–10.5)
nRBC: 0 % (ref 0.0–0.2)

## 2019-10-07 LAB — URINALYSIS, COMPLETE (UACMP) WITH MICROSCOPIC
Bilirubin Urine: NEGATIVE
Glucose, UA: NEGATIVE mg/dL
Hgb urine dipstick: NEGATIVE
Ketones, ur: NEGATIVE mg/dL
Nitrite: NEGATIVE
Protein, ur: NEGATIVE mg/dL
Specific Gravity, Urine: 1.005 (ref 1.005–1.030)
pH: 5 (ref 5.0–8.0)

## 2019-10-07 LAB — LIPASE, BLOOD: Lipase: 38 U/L (ref 11–51)

## 2019-10-07 MED ORDER — SODIUM CHLORIDE 0.9% FLUSH: 3.0000 mL | Freq: Once | INTRAVENOUS | Status: DC

## 2019-10-07 NOTE — ED Triage Notes (Signed)
Pt c/o intermittent RLQ pain for the past 2 months, in the past couple of days more intense and PCP wants to make sure it is not the appendix. States she had some diarrhea on Friday, none since. Pt is in NAD,

## 2019-10-07 NOTE — ED Provider Notes (Signed)
[facility] Emergency Department Provider Note       Time seen: ----------------------------------------- 3:28 PM on 10/07/2019 -----------------------------------------   I have reviewed the triage vital signs and the nursing notes.  HISTORY   Chief Complaint Abdominal Pain    HPI Krista Meyer is a 49 y.o. female with a history of abnormal uterine bleeding, depression, pulmonary nodules, restless leg syndrome who presents to the ED for right lower quadrant pain for the past 2 months.  Over the past several days it has been more intense, her doctor wants to make sure it is not her appendix.  She had some diarrhea on Friday but none since.  Pain is 3 out of 10.  Past Medical History:  Diagnosis Date  . Abnormal uterine bleeding (AUB)   . ADHD   . History of exercise stress test 02-17-2009 by dr Irish Lack   non-diagnostic given baseline ECG, abnormality. No sx. Doubt ischemic  . Intermittent palpitations   . Localization-related focal epilepsy with complex partial seizures [facility]) dx age 35--- neurologist-- dr Tomi Likens ([place])   per lov note -- stable/  per pt last seizure age 32, controlled w/ medication  . Major depression   . Mild mitral valve prolapse   . Pulmonary nodules 07/31/2016  . Restless leg syndrome     Patient Active Problem List   Diagnosis Date Noted  . Palpitations 07/31/2016  . Atypical chest pain 07/31/2016  . Mild mitral valve prolapse 07/31/2016  . Mild mitral regurgitation 07/31/2016  . Pulmonary nodules 07/31/2016  . Seizure ([place]) 12/24/2012  . RLS (restless legs syndrome) 12/24/2012  . Localization-related epilepsy ([place]) 12/24/2012    Past Surgical History:  Procedure Laterality Date  . DILITATION & CURRETTAGE/HYSTROSCOPY WITH NOVASURE ABLATION N/A 03/30/2017   Procedure: DILATATION & CURETTAGE/HYSTEROSCOPY WITH NOVASURE ABLATION;  Surgeon: Tyson Dense, MD;  Location: [facility];  Service:  Gynecology;  Laterality: N/A;  . NASAL SEPTUM SURGERY  TEEN  . PARTIAL MASTECTOMY WITH NEEDLE LOCALIZATION Right 07-23-2007   dr Zella Richer  [facility]   benign  . TRANSTHORACIC ECHOCARDIOGRAM  02-17-2009   dr Irish Lack   ef 65-70%/  mild MR and mild MVP/  trivial TR/  mild PI    Allergies Betadine [povidone iodine], Ivp dye [iodinated diagnostic agents], and Shellfish allergy  Social History Social History   Tobacco Use  . Smoking status: Never Smoker  . Smokeless tobacco: Never Used  Substance Use Topics  . Alcohol use: No  . Drug use: No    Review of Systems Constitutional: Negative for fever. Cardiovascular: Negative for chest pain. Respiratory: Negative for shortness of breath. Gastrointestinal: Positive for abdominal pain Musculoskeletal: Negative for back pain. Skin: Negative for rash. Neurological: Negative for headaches, focal weakness or numbness.  All systems negative/normal/unremarkable except as stated in the HPI  ____________________________________________   PHYSICAL EXAM:  VITAL SIGNS: ED Triage Vitals  Enc Vitals Group     BP 10/07/19 1417 106/67     Pulse Rate 10/07/19 1417 (!) 58     Resp 10/07/19 1417 16     Temp 10/07/19 1417 97.8 F (36.6 C)     Temp Source 10/07/19 1417 Oral     SpO2 10/07/19 1417 98 %     Weight 10/07/19 1419 143 lb (64.9 kg)     Height 10/07/19 1418 5\' 3"  (1.6 m)     Head Circumference --      Peak Flow --      Pain Score 10/07/19  1418 3     Pain Loc --      Pain Edu? --      Excl. in [place]? --    Constitutional: Alert and oriented. Well appearing and in no distress. Eyes: Conjunctivae are normal. Normal extraocular movements. ENT      Head: Normocephalic and atraumatic.      Nose: No congestion/rhinnorhea.      Mouth/Throat: Mucous membranes are moist.      Neck: No stridor. Cardiovascular: Normal rate, regular rhythm. No murmurs, rubs, or gallops. Respiratory: Normal respiratory effort without tachypnea nor  retractions. Breath sounds are clear and equal bilaterally. No wheezes/rales/rhonchi. Gastrointestinal: Mild right lower quadrant and suprapubic tenderness, no rebound or guarding.  Normal bowel sounds. Musculoskeletal: Nontender with normal range of motion in extremities. No lower extremity tenderness nor edema. Neurologic:  Normal speech and language. No gross focal neurologic deficits are appreciated.  Skin:  Skin is warm, dry and intact. No rash noted. Psychiatric: Mood and affect are normal. Speech and behavior are normal.  ____________________________________________  ED COURSE:  As part of my medical decision making, I reviewed the following data within the [place] History obtained from family if available, nursing notes, old chart and ekg, as well as notes from prior ED visits. Patient presented for right lower quadrant pain, we will assess with labs and imaging as indicated at this time.   Procedures  Krista Meyer was evaluated in Emergency Department on 10/07/2019 for the symptoms described in the history of present illness. She was evaluated in the context of the global COVID-19 pandemic, which necessitated consideration that the patient might be at risk for infection with the SARS-CoV-2 virus that causes COVID-19. Institutional protocols and algorithms that pertain to the evaluation of patients at risk for COVID-19 are in a state of rapid change based on information released by regulatory bodies including the CDC and federal and state organizations. These policies and algorithms were followed during the patient's care in the ED.  ____________________________________________   LABS (pertinent positives/negatives)  Labs Reviewed  COMPREHENSIVE METABOLIC PANEL - Abnormal; Notable for the following components:      Result Value   AST 14 (*)    All other components within normal limits  URINALYSIS, COMPLETE (UACMP) WITH MICROSCOPIC - Abnormal; Notable for  the following components:   Color, Urine YELLOW (*)    APPearance CLOUDY (*)    Leukocytes,Ua MODERATE (*)    Bacteria, UA FEW (*)    All other components within normal limits  URINE CULTURE  LIPASE, BLOOD  CBC    RADIOLOGY Images were viewed by me  CT renal protocol IMPRESSION: No evidence of renal calculi or urinary tract obstructive changes.  Normal-appearing appendix.  Changes of uterine fibroids which may contribute to the patient's discomfort.  ____________________________________________   DIFFERENTIAL DIAGNOSIS   Renal colic, UTI, pyelonephritis, appendicitis, ovarian cyst, torsion, mass  FINAL ASSESSMENT AND PLAN  Abdominal pain, fibroid uterus   Plan: The patient had presented for right lower quadrant pain. Patient's labs revealed some hematuria and leukocytes in her urine. Patient's imaging did reveal uterine fibroids which are possibly contributing to her pain.  I have sent a urine culture.  Otherwise she is cleared for outpatient follow-up.   Laurence Aly, MD    Note: This note was generated in part or whole with voice recognition software. Voice recognition is usually quite accurate but there are transcription errors that can and very often do occur. I apologize  for any typographical errors that were not detected and corrected.     Earleen Newport, MD 10/07/19 [phone]

## 2019-10-08 LAB — URINE CULTURE

## 2020-07-16 ENCOUNTER — Other Ambulatory Visit (HOSPITAL_COMMUNITY): Payer: Self-pay | Admitting: Internal Medicine

## 2020-07-16 ENCOUNTER — Ambulatory Visit: Payer: Managed Care, Other (non HMO) | Attending: Internal Medicine

## 2020-07-16 DIAGNOSIS — Z23 Encounter for immunization: Secondary | ICD-10-CM

## 2020-07-16 NOTE — Progress Notes (Signed)
   Covid-19 Vaccination Clinic  Name:  Krista Meyer    MRN: [id number] DOB: 22-Jun-1970  07/16/2020  Krista Meyer was observed post Covid-19 immunization for 15 minutes without incident. She was provided with Vaccine Information Sheet and instruction to access the V-Safe system.   Krista Meyer was instructed to call 911 with any severe reactions post vaccine: Marland Kitchen Difficulty breathing  . Swelling of face and throat  . A fast heartbeat  . A bad rash all over body  . Dizziness and weakness   Immunizations Administered    Name Date Dose VIS Date Route   PFIZER Comrnaty([place]) Covid-19 Vaccine 07/16/2020 10:14 AM 0.3 mL 05/06/2020 Intramuscular   Manufacturer: [employer]   Lot: [id number]   [place]: [phone]

## 2020-08-27 ENCOUNTER — Ambulatory Visit: Payer: Managed Care, Other (non HMO) | Admitting: Internal Medicine

## 2020-08-27 ENCOUNTER — Other Ambulatory Visit: Payer: Self-pay

## 2020-08-27 ENCOUNTER — Encounter: Payer: Self-pay | Admitting: Internal Medicine

## 2020-08-27 VITALS — BP 96/63 | HR 60 | Temp 99.1°F | Ht 62.5 in | Wt 135.0 lb

## 2020-08-27 DIAGNOSIS — R1030 Lower abdominal pain, unspecified: Secondary | ICD-10-CM | POA: Diagnosis not present

## 2020-08-27 DIAGNOSIS — F339 Major depressive disorder, recurrent, unspecified: Secondary | ICD-10-CM

## 2020-08-27 DIAGNOSIS — I341 Nonrheumatic mitral (valve) prolapse: Secondary | ICD-10-CM | POA: Diagnosis not present

## 2020-08-27 DIAGNOSIS — M545 Low back pain, unspecified: Secondary | ICD-10-CM | POA: Diagnosis not present

## 2020-08-27 DIAGNOSIS — M1990 Unspecified osteoarthritis, unspecified site: Secondary | ICD-10-CM

## 2020-08-27 DIAGNOSIS — G8929 Other chronic pain: Secondary | ICD-10-CM

## 2020-08-27 LAB — URINALYSIS, ROUTINE W REFLEX MICROSCOPIC
Bilirubin, UA: NEGATIVE
Glucose, UA: NEGATIVE
Ketones, UA: NEGATIVE
Leukocytes,UA: NEGATIVE
Nitrite, UA: NEGATIVE
Protein,UA: NEGATIVE
RBC, UA: NEGATIVE
Specific Gravity, UA: 1.015 (ref 1.005–1.030)
Urobilinogen, Ur: 0.2 mg/dL (ref 0.2–1.0)
pH, UA: 7 (ref 5.0–7.5)

## 2020-08-27 MED ORDER — LUBIPROSTONE 8 MCG PO CAPS: 8.0000 ug | ORAL_CAPSULE | Freq: Two times a day (BID) | ORAL | 3 refills | Status: DC

## 2020-08-27 NOTE — Progress Notes (Signed)
BP 96/63   Pulse 60   Temp 99.1 F (37.3 C) (Oral)   Ht 5' 2.5" (1.588 m)   Wt 135 lb (61.2 kg)   LMP 01/31/2017 (Approximate)   SpO2 96%   BMI 24.30 kg/m    Subjective:    Patient ID: [name], female    DOB: [birth year], 50 y.o.   MRN: [id number]  HPI: Krista Meyer is a 50 y.o. female  Pt is here to establish care. 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  Groin Pain The patient's pertinent negatives include no genital rash. Primary symptoms comment: has had constipation - . This is a chronic (in a week defecate 3- 4 times eats less of fruti, does eat high ) problem. The problem has been gradually improving. Associated symptoms include abdominal pain, back pain, constipation and joint pain. Pertinent negatives include no anorexia, chills, diarrhea, discolored urine, dysuria, fever, flank pain, frequency, headaches, hematuria, joint swelling, nausea, rash, sore throat, urgency or vomiting.  Arthritis Presents for initial (l spine slipped disc 20 yrs ago chronic lower back pain and middle back pain ) visit. (Has had constipation - ) Pertinent negatives include no diarrhea, dysuria, fever or rash.  Seizures  This is a chronic problem. Pertinent negatives include no headaches, no sore throat, no nausea, no vomiting and no diarrhea.    Chief Complaint  Patient presents with  . Establish Care    Patient is here to establish care and she would like to discuss a chronic pain in her groin area.    Relevant past medical, surgical, family and social history reviewed and updated as indicated. Interim medical history since our last visit reviewed. Allergies and medications reviewed and updated.  Review of Systems  Constitutional: Negative for chills and fever.  HENT: Negative for sore throat.   Gastrointestinal: Positive for abdominal pain and constipation. Negative for anorexia, diarrhea, nausea and vomiting.  Genitourinary: Negative for dysuria, flank pain,  frequency, hematuria and urgency.  Musculoskeletal: Positive for arthritis, back pain and joint pain.  Skin: Negative for rash.  Neurological: Positive for seizures. Negative for headaches.    Per HPI unless specifically indicated above     Objective:    BP 96/63   Pulse 60   Temp 99.1 F (37.3 C) (Oral)   Ht 5' 2.5" (1.588 m)   Wt 135 lb (61.2 kg)   LMP 01/31/2017 (Approximate)   SpO2 96%   BMI 24.30 kg/m   Wt Readings from Last 3 Encounters:  08/27/20 135 lb (61.2 kg)  10/07/19 143 lb (64.9 kg)  08/14/17 148 lb 3.2 oz (67.2 kg)    Physical Exam  Results for orders placed or performed during the hospital encounter of 10/07/19  Urine culture   Specimen: Urine, Clean Catch  Result Value Ref Range   Specimen Description      URINE, CLEAN CATCH Performed at [facility], [address], [place], [place] [address]    Special Requests      NONE Performed at [facility], [address], [place], [place] [address]    Culture MULTIPLE SPECIES PRESENT, SUGGEST RECOLLECTION (A)    Report Status 10/08/2019 FINAL   Lipase, blood  Result Value Ref Range   Lipase 38 11 - 51 U/L  Comprehensive metabolic panel  Result Value Ref Range   Sodium 139 135 - 145 mmol/L   Potassium 4.3 3.5 - 5.1 mmol/L   Chloride 104 98 - 111 mmol/L  CO2 29 22 - 32 mmol/L   Glucose, Bld 99 70 - 99 mg/dL   BUN 8 6 - 20 mg/dL   Creatinine, Ser 0.60 0.44 - 1.00 mg/dL   Calcium 9.2 8.9 - 10.3 mg/dL   Total Protein 7.7 6.5 - 8.1 g/dL   Albumin 4.4 3.5 - 5.0 g/dL   AST 14 (L) 15 - 41 U/L   ALT 10 0 - 44 U/L   Alkaline Phosphatase 47 38 - 126 U/L   Total Bilirubin 0.9 0.3 - 1.2 mg/dL   GFR calc non Af Amer >60 >60 mL/min   GFR calc Af Amer >60 >60 mL/min   Anion gap 6 5 - 15  CBC  Result Value Ref Range   WBC 6.2 4.0 - 10.5 K/uL   RBC 4.17 3.87 - 5.11 MIL/uL   Hemoglobin 13.3 12.0 - 15.0 g/dL   HCT 37.6 36.0 - 46.0 %   MCV 90.2 80.0 - 100.0 fL   MCH 31.9 26.0 - 34.0 pg    MCHC 35.4 30.0 - 36.0 g/dL   RDW 11.7 11.5 - 15.5 %   Platelets 252 150 - 400 K/uL   nRBC 0.0 0.0 - 0.2 %  Urinalysis, Complete w Microscopic  Result Value Ref Range   Color, Urine YELLOW (A) YELLOW   APPearance CLOUDY (A) CLEAR   Specific Gravity, Urine 1.005 1.005 - 1.030   pH 5.0 5.0 - 8.0   Glucose, UA NEGATIVE NEGATIVE mg/dL   Hgb urine dipstick NEGATIVE NEGATIVE   Bilirubin Urine NEGATIVE NEGATIVE   Ketones, ur NEGATIVE NEGATIVE mg/dL   Protein, ur NEGATIVE NEGATIVE mg/dL   Nitrite NEGATIVE NEGATIVE   Leukocytes,Ua MODERATE (A) NEGATIVE   RBC / HPF 11-20 0 - 5 RBC/hpf   WBC, UA 0-5 0 - 5 WBC/hpf   Bacteria, UA FEW (A) NONE SEEN   Squamous Epithelial / LPF 0-5 0 - 5   Mucus PRESENT         Current Outpatient Medications:  .  acetaminophen (TYLENOL) 325 MG tablet, Take 325 mg by mouth every 6 (six) hours as needed for pain (prn)., Disp: , Rfl:  .  COVID-19 Specimen Collection KIT, See admin instructions., Disp: , Rfl:  .  Desvenlafaxine Succinate ER 25 MG TB24, Take 1 tablet by mouth daily., Disp: , Rfl:  .  docusate sodium (COLACE) 100 MG capsule, Take 1 capsule (100 mg total) by mouth 2 (two) times daily., Disp: 30 capsule, Rfl: 2 .  levETIRAcetam (KEPPRA XR) 500 MG 24 hr tablet, TAKE 1 TABLET BY MOUTH TWO  TIMES DAILY, Disp: 180 tablet, Rfl: 0 .  amoxicillin-clavulanate (AUGMENTIN) 875-125 MG tablet, , Disp: , Rfl:  .  ipratropium (ATROVENT) 0.06 % nasal spray, , Disp: , Rfl:  .  oxyCODONE (OXY IR/ROXICODONE) 5 MG immediate release tablet, Take 1 tablet (5 mg total) by mouth every 4 (four) hours as needed for severe pain. (Patient not taking: Reported on 08/27/2020), Disp: 15 tablet, Rfl: 0 .  Polyethylene Glycol 3350 (MIRALAX PO), Take by mouth as needed. (Patient not taking: Reported on 08/27/2020), Disp: , Rfl:  .  RECTIV 0.4 % OINT, Place rectally 2 (two) times daily. (Patient not taking: Reported on 08/27/2020), Disp: , Rfl:  .  sulfamethoxazole-trimethoprim (BACTRIM DS)  800-160 MG tablet, 1 tablet (Patient not taking: Reported on 08/27/2020), Disp: , Rfl:  .  venlafaxine XR (EFFEXOR-XR) 37.5 MG 24 hr capsule, Take 1 capsule (37.5 mg total) by mouth daily with breakfast. Take for  2 weeks then stop (Patient not taking: Reported on 08/27/2020), Disp: 14 capsule, Rfl: 0    Assessment & Plan:  1. Constipation  Check AXR  START amitiza 8 mcg  2. Arthritis  Chronic, will need to fu with ortho for such   3. Seizures - sees neurology @ Dr Melrose Nakayama [facility]. Refills meds - keppra for such   4. Depression is on pristiq     Problem List Items Addressed This Visit   None      Follow up plan: No follow-ups on file.

## 2020-08-28 LAB — CBC WITH DIFFERENTIAL/PLATELET
Basophils Absolute: 0.1 10*3/uL (ref 0.0–0.2)
Basos: 1 %
EOS (ABSOLUTE): 0.1 10*3/uL (ref 0.0–0.4)
Eos: 2 %
Hematocrit: 42.3 % (ref 34.0–46.6)
Hemoglobin: 14.1 g/dL (ref 11.1–15.9)
Immature Grans (Abs): 0 10*3/uL (ref 0.0–0.1)
Immature Granulocytes: 0 %
Lymphocytes Absolute: 2.1 10*3/uL (ref 0.7–3.1)
Lymphs: 35 %
MCH: 31.3 pg (ref 26.6–33.0)
MCHC: 33.3 g/dL (ref 31.5–35.7)
MCV: 94 fL (ref 79–97)
Monocytes Absolute: 0.4 10*3/uL (ref 0.1–0.9)
Monocytes: 7 %
Neutrophils Absolute: 3.3 10*3/uL (ref 1.4–7.0)
Neutrophils: 55 %
Platelets: 294 10*3/uL (ref 150–450)
RBC: 4.5 x10E6/uL (ref 3.77–5.28)
RDW: 11.5 % — ABNORMAL LOW (ref 11.7–15.4)
WBC: 6.1 10*3/uL (ref 3.4–10.8)

## 2020-08-28 LAB — COMPREHENSIVE METABOLIC PANEL
ALT: 10 IU/L (ref 0–32)
AST: 15 IU/L (ref 0–40)
Albumin/Globulin Ratio: 1.9 (ref 1.2–2.2)
Albumin: 5 g/dL — ABNORMAL HIGH (ref 3.8–4.8)
Alkaline Phosphatase: 52 IU/L (ref 44–121)
BUN/Creatinine Ratio: 16 (ref 9–23)
BUN: 12 mg/dL (ref 6–24)
Bilirubin Total: 0.5 mg/dL (ref 0.0–1.2)
CO2: 27 mmol/L (ref 20–29)
Calcium: 9.5 mg/dL (ref 8.7–10.2)
Chloride: 102 mmol/L (ref 96–106)
Creatinine, Ser: 0.74 mg/dL (ref 0.57–1.00)
Globulin, Total: 2.6 g/dL (ref 1.5–4.5)
Glucose: 79 mg/dL (ref 65–99)
Potassium: 4.4 mmol/L (ref 3.5–5.2)
Sodium: 140 mmol/L (ref 134–144)
Total Protein: 7.6 g/dL (ref 6.0–8.5)
eGFR: 99 mL/min/{1.73_m2} (ref >59)

## 2020-08-28 LAB — TSH: TSH: 0.754 u[IU]/mL (ref 0.450–4.500)

## 2020-08-31 LAB — ANA+ENA+DNA/DS+ANTICH+CENTR
ANA Titer 1: NEGATIVE
Anti JO-1: <0.2 AI (ref 0.0–0.9)
Centromere Ab Screen: <0.2 AI (ref 0.0–0.9)
Chromatin Ab SerPl-aCnc: <0.2 AI (ref 0.0–0.9)
ENA RNP Ab: <0.2 AI (ref 0.0–0.9)
ENA SM Ab Ser-aCnc: <0.2 AI (ref 0.0–0.9)
ENA SSA (RO) Ab: <0.2 AI (ref 0.0–0.9)
ENA SSB (LA) Ab: <0.2 AI (ref 0.0–0.9)
Scleroderma (Scl-70) (ENA) Antibody, IgG: <0.2 AI (ref 0.0–0.9)
dsDNA Ab: 5 IU/mL (ref 0–9)

## 2020-08-31 LAB — URIC ACID: Uric Acid: 3 mg/dL (ref 2.6–6.2)

## 2020-08-31 LAB — ANA: Anti Nuclear Antibody (ANA): NEGATIVE

## 2020-08-31 LAB — RHEUMATOID FACTOR: Rheumatoid fact SerPl-aCnc: <10 IU/mL (ref <14.0)

## 2020-08-31 LAB — LYME AB/WESTERN BLOT REFLEX
LYME DISEASE AB, QUANT, IGM: <0.8 index (ref 0.00–0.79)
Lyme IgG/IgM Ab: <0.91 {ISR} (ref 0.00–0.90)

## 2020-09-01 ENCOUNTER — Telehealth: Payer: Self-pay

## 2020-09-01 NOTE — Telephone Encounter (Signed)
PA started for Lubiprostone 8 mcg caps through Cover my meds, waiting on determination.

## 2020-09-03 ENCOUNTER — Other Ambulatory Visit: Payer: Managed Care, Other (non HMO)

## 2020-09-09 ENCOUNTER — Telehealth: Payer: Self-pay

## 2020-09-09 NOTE — Telephone Encounter (Signed)
If pt calls back please reschedule Lab as she was canceled due to apt being on 09/10/2020 and we are closed.

## 2020-09-10 ENCOUNTER — Other Ambulatory Visit: Payer: Managed Care, Other (non HMO)

## 2020-09-17 ENCOUNTER — Ambulatory Visit: Payer: Managed Care, Other (non HMO) | Admitting: Internal Medicine

## 2020-09-17 ENCOUNTER — Encounter: Payer: Self-pay | Admitting: Internal Medicine

## 2020-09-17 ENCOUNTER — Other Ambulatory Visit: Payer: Self-pay

## 2020-09-17 VITALS — Ht 62.52 in | Wt 131.6 lb

## 2020-09-17 DIAGNOSIS — Z13220 Encounter for screening for lipoid disorders: Secondary | ICD-10-CM

## 2020-09-17 DIAGNOSIS — I341 Nonrheumatic mitral (valve) prolapse: Secondary | ICD-10-CM

## 2020-09-17 DIAGNOSIS — Z1329 Encounter for screening for other suspected endocrine disorder: Secondary | ICD-10-CM

## 2020-09-17 NOTE — Progress Notes (Signed)
Ht 5' 2.52" (1.588 m)   Wt 131 lb 9.6 oz (59.7 kg)   LMP 01/31/2017 (Approximate)   BMI 23.67 kg/m    Subjective:    Patient ID: Krista Meyer, female    DOB: [birth year], 50 y.o.   MRN: [id number]  HPI: Krista Meyer is a 50 y.o. female  Pt is here to establish care. 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  Depression        This is a chronic problem.  The current episode started more than 1 year ago.  Back Pain This is a chronic problem. Pertinent negatives include no abdominal pain, bladder incontinence, fever, numbness or paresis.  Arthritis Presents for follow-up (l spine slipped disc 20 yrs ago chronic lower back pain and middle back pain ) visit. She reports no pain or joint warmth. Pertinent negatives include no fever.  Seizures  This is a chronic problem. Characteristics do not include bladder incontinence.  Constipation This is a chronic problem. The problem has been gradually improving since onset. Associated symptoms include back pain. Pertinent negatives include no abdominal pain, fecal incontinence, fever or flatus.    Chief Complaint  Patient presents with  . Depression  . Back Pain    States that the pain is a lot of better    Relevant past medical, surgical, family and social history reviewed and updated as indicated. Interim medical history since our last visit reviewed. Allergies and medications reviewed and updated.  Review of Systems  Constitutional: Negative for fever.  Gastrointestinal: Positive for constipation. Negative for abdominal pain and flatus.  Genitourinary: Negative for bladder incontinence.  Musculoskeletal: Positive for arthritis.  Neurological: Positive for seizures. Negative for numbness.  Psychiatric/Behavioral: Positive for depression.    Per HPI unless specifically indicated above     Objective:    Ht 5' 2.52" (1.588 m)   Wt 131 lb 9.6 oz (59.7 kg)   LMP 01/31/2017 (Approximate)   BMI 23.67 kg/m   Wt  Readings from Last 3 Encounters:  09/17/20 131 lb 9.6 oz (59.7 kg)  08/27/20 135 lb (61.2 kg)  10/07/19 143 lb (64.9 kg)    Physical Exam Vitals and nursing note reviewed.  Constitutional:      General: She is not in acute distress.    Appearance: Normal appearance. She is not ill-appearing or diaphoretic.  HENT:     Head: Normocephalic and atraumatic.     Right Ear: Tympanic membrane and external ear normal. There is no impacted cerumen.     Left Ear: External ear normal.     Nose: No congestion or rhinorrhea.     Mouth/Throat:     Pharynx: No oropharyngeal exudate or posterior oropharyngeal erythema.  Eyes:     Conjunctiva/sclera: Conjunctivae normal.     Pupils: Pupils are equal, round, and reactive to light.  Cardiovascular:     Rate and Rhythm: Normal rate and regular rhythm.     Heart sounds: No murmur heard. No friction rub. No gallop.   Pulmonary:     Effort: No respiratory distress.     Breath sounds: No stridor. No wheezing or rhonchi.  Chest:     Chest wall: No tenderness.  Abdominal:     General: Abdomen is flat. Bowel sounds are normal. There is no distension.     Palpations: Abdomen is soft. There is no mass.     Tenderness: There is no abdominal tenderness. There is no guarding.  Musculoskeletal:  General: No swelling or deformity.     Cervical back: Normal range of motion and neck supple. No rigidity or tenderness.     Right lower leg: No edema.     Left lower leg: No edema.  Skin:    General: Skin is warm and dry.     Coloration: Skin is not jaundiced.     Findings: No erythema.  Neurological:     Mental Status: She is alert and oriented to person, place, and time. Mental status is at baseline.  Psychiatric:        Mood and Affect: Mood normal.        Behavior: Behavior normal.        Thought Content: Thought content normal.        Judgment: Judgment normal.     Results for orders placed or performed in visit on 08/27/20  TSH  Result Value  Ref Range   TSH 0.754 0.450 - 4.500 uIU/mL  Urinalysis, Routine w reflex microscopic  Result Value Ref Range   Specific Gravity, UA 1.015 1.005 - 1.030   pH, UA 7.0 5.0 - 7.5   Color, UA Yellow Yellow   Appearance Ur Clear Clear   Leukocytes,UA Negative Negative   Protein,UA Negative Negative/Trace   Glucose, UA Negative Negative   Ketones, UA Negative Negative   RBC, UA Negative Negative   Bilirubin, UA Negative Negative   Urobilinogen, Ur 0.2 0.2 - 1.0 mg/dL   Nitrite, UA Negative Negative  Comprehensive metabolic panel  Result Value Ref Range   Glucose 79 65 - 99 mg/dL   BUN 12 6 - 24 mg/dL   Creatinine, Ser 0.74 0.57 - 1.00 mg/dL   eGFR 99 >59 mL/min/1.73   BUN/Creatinine Ratio 16 9 - 23   Sodium 140 134 - 144 mmol/L   Potassium 4.4 3.5 - 5.2 mmol/L   Chloride 102 96 - 106 mmol/L   CO2 27 20 - 29 mmol/L   Calcium 9.5 8.7 - 10.2 mg/dL   Total Protein 7.6 6.0 - 8.5 g/dL   Albumin 5.0 (H) 3.8 - 4.8 g/dL   Globulin, Total 2.6 1.5 - 4.5 g/dL   Albumin/Globulin Ratio 1.9 1.2 - 2.2   Bilirubin Total 0.5 0.0 - 1.2 mg/dL   Alkaline Phosphatase 52 44 - 121 IU/L   AST 15 0 - 40 IU/L   ALT 10 0 - 32 IU/L  CBC with Differential/Platelet  Result Value Ref Range   WBC 6.1 3.4 - 10.8 x10E3/uL   RBC 4.50 3.77 - 5.28 x10E6/uL   Hemoglobin 14.1 11.1 - 15.9 g/dL   Hematocrit 42.3 34.0 - 46.6 %   MCV 94 79 - 97 fL   MCH 31.3 26.6 - 33.0 pg   MCHC 33.3 31.5 - 35.7 g/dL   RDW 11.5 (L) 11.7 - 15.4 %   Platelets 294 150 - 450 x10E3/uL   Neutrophils 55 Not Estab. %   Lymphs 35 Not Estab. %   Monocytes 7 Not Estab. %   Eos 2 Not Estab. %   Basos 1 Not Estab. %   Neutrophils Absolute 3.3 1.4 - 7.0 x10E3/uL   Lymphocytes Absolute 2.1 0.7 - 3.1 x10E3/uL   Monocytes Absolute 0.4 0.1 - 0.9 x10E3/uL   EOS (ABSOLUTE) 0.1 0.0 - 0.4 x10E3/uL   Basophils Absolute 0.1 0.0 - 0.2 x10E3/uL   Immature Granulocytes 0 Not Estab. %   Immature Grans (Abs) 0.0 0.0 - 0.1 x10E3/uL  Rheumatoid factor   Result Value Ref  Range   Rhuematoid fact SerPl-aCnc <10.0 <14.0 IU/mL  ANA  Result Value Ref Range   Anti Nuclear Antibody (ANA) Negative Negative  ANA+ENA+DNA/DS+Antich+Centr  Result Value Ref Range   ANA Titer 1 Negative    dsDNA Ab 5 0 - 9 IU/mL   ENA RNP Ab <0.2 0.0 - 0.9 AI   ENA SM Ab Ser-aCnc <0.2 0.0 - 0.9 AI   Scleroderma (Scl-70) (ENA) Antibody, IgG <0.2 0.0 - 0.9 AI   ENA SSA (RO) Ab <0.2 0.0 - 0.9 AI   ENA SSB (LA) Ab <0.2 0.0 - 0.9 AI   Chromatin Ab SerPl-aCnc <0.2 0.0 - 0.9 AI   Anti JO-1 <0.2 0.0 - 0.9 AI   Centromere Ab Screen <0.2 0.0 - 0.9 AI   See below: Comment   Uric acid  Result Value Ref Range   Uric Acid 3.0 2.6 - 6.2 mg/dL  Lyme Ab/Western Blot Reflex  Result Value Ref Range   Lyme IgG/IgM Ab <0.91 0.00 - 0.90 ISR   LYME DISEASE AB, QUANT, IGM <0.80 0.00 - 0.79 index        Current Outpatient Medications:  .  acetaminophen (TYLENOL) 325 MG tablet, Take 325 mg by mouth every 6 (six) hours as needed for pain (prn)., Disp: , Rfl:  .  Desvenlafaxine Succinate ER 25 MG TB24, Take 1 tablet by mouth daily., Disp: , Rfl:  .  docusate sodium (COLACE) 100 MG capsule, Take 1 capsule (100 mg total) by mouth 2 (two) times daily., Disp: 30 capsule, Rfl: 2 .  levETIRAcetam (KEPPRA XR) 500 MG 24 hr tablet, TAKE 1 TABLET BY MOUTH TWO  TIMES DAILY, Disp: 180 tablet, Rfl: 0 .  Polyethylene Glycol 3350 (MIRALAX PO), Take by mouth as needed., Disp: , Rfl:  .  lubiprostone (AMITIZA) 8 MCG capsule, Take 1 capsule (8 mcg total) by mouth 2 (two) times daily with a meal. (Patient not taking: Reported on 09/17/2020), Disp: 30 capsule, Rfl: 3 .  RECTIV 0.4 % OINT, Place rectally 2 (two) times daily. (Patient not taking: No sig reported), Disp: , Rfl:     Assessment & Plan:  1. Constipation :  Check AXR  Sent in amitiza 8 mcg , pt didn't take this.  2. Arthritis  Chronic, will need to fu with ortho for such  -ve for RA/SLE had referred to ortho  Might need   3. Seizures  - sees neurology @ Dr Melrose Nakayama @ [facility]. Refills meds keppra for such - has SE per her makes her anxious  4. Depression is on pristiq stable.  5. MVP - sees dr. Clayborn Bigness [facility] on Monday Stable, chornic , asymptomatic   Problem List Items Addressed This Visit   None      Follow up plan: No follow-ups on file.   Mammogram due 2017  cscope - next year;  DEXA no periods sec to a D/C  Consider ordering next visit.

## 2020-12-10 ENCOUNTER — Ambulatory Visit: Payer: Managed Care, Other (non HMO)

## 2020-12-10 ENCOUNTER — Other Ambulatory Visit: Payer: Managed Care, Other (non HMO)

## 2020-12-10 ENCOUNTER — Other Ambulatory Visit: Payer: Self-pay

## 2020-12-10 DIAGNOSIS — I341 Nonrheumatic mitral (valve) prolapse: Secondary | ICD-10-CM

## 2020-12-10 DIAGNOSIS — Z13220 Encounter for screening for lipoid disorders: Secondary | ICD-10-CM

## 2020-12-10 DIAGNOSIS — Z1329 Encounter for screening for other suspected endocrine disorder: Secondary | ICD-10-CM

## 2020-12-11 LAB — LIPID PANEL
Chol/HDL Ratio: 3.3 ratio (ref 0.0–4.4)
Cholesterol, Total: 189 mg/dL (ref 100–199)
HDL: 58 mg/dL (ref >39)
LDL Chol Calc (NIH): 118 mg/dL — ABNORMAL HIGH (ref 0–99)
Triglycerides: 68 mg/dL (ref 0–149)
VLDL Cholesterol Cal: 13 mg/dL (ref 5–40)

## 2020-12-11 LAB — CBC WITH DIFFERENTIAL/PLATELET
Basophils Absolute: 0.1 10*3/uL (ref 0.0–0.2)
Basos: 1 %
EOS (ABSOLUTE): 0.1 10*3/uL (ref 0.0–0.4)
Eos: 2 %
Hematocrit: 37.7 % (ref 34.0–46.6)
Hemoglobin: 12.8 g/dL (ref 11.1–15.9)
Immature Grans (Abs): 0 10*3/uL (ref 0.0–0.1)
Immature Granulocytes: 0 %
Lymphocytes Absolute: 1.5 10*3/uL (ref 0.7–3.1)
Lymphs: 25 %
MCH: 31.4 pg (ref 26.6–33.0)
MCHC: 34 g/dL (ref 31.5–35.7)
MCV: 93 fL (ref 79–97)
Monocytes Absolute: 0.4 10*3/uL (ref 0.1–0.9)
Monocytes: 6 %
Neutrophils Absolute: 3.9 10*3/uL (ref 1.4–7.0)
Neutrophils: 66 %
Platelets: 243 10*3/uL (ref 150–450)
RBC: 4.07 x10E6/uL (ref 3.77–5.28)
RDW: 11.5 % — ABNORMAL LOW (ref 11.7–15.4)
WBC: 5.9 10*3/uL (ref 3.4–10.8)

## 2020-12-11 LAB — COMPREHENSIVE METABOLIC PANEL
ALT: 7 IU/L (ref 0–32)
AST: 11 IU/L (ref 0–40)
Albumin/Globulin Ratio: 1.7 (ref 1.2–2.2)
Albumin: 4.3 g/dL (ref 3.8–4.8)
Alkaline Phosphatase: 47 IU/L (ref 44–121)
BUN/Creatinine Ratio: 11 (ref 9–23)
BUN: 7 mg/dL (ref 6–24)
Bilirubin Total: 0.5 mg/dL (ref 0.0–1.2)
CO2: 24 mmol/L (ref 20–29)
Calcium: 8.7 mg/dL (ref 8.7–10.2)
Chloride: 103 mmol/L (ref 96–106)
Creatinine, Ser: 0.65 mg/dL (ref 0.57–1.00)
Globulin, Total: 2.5 g/dL (ref 1.5–4.5)
Glucose: 84 mg/dL (ref 65–99)
Potassium: 4.2 mmol/L (ref 3.5–5.2)
Sodium: 139 mmol/L (ref 134–144)
Total Protein: 6.8 g/dL (ref 6.0–8.5)
eGFR: 107 mL/min/{1.73_m2} (ref >59)

## 2020-12-11 LAB — THYROID PANEL WITH TSH
Free Thyroxine Index: 2.3 (ref 1.2–4.9)
T3 Uptake Ratio: 30 % (ref 24–39)
T4, Total: 7.6 ug/dL (ref 4.5–12.0)
TSH: 0.681 u[IU]/mL (ref 0.450–4.500)

## 2020-12-17 ENCOUNTER — Encounter: Payer: Self-pay | Admitting: Internal Medicine

## 2020-12-17 ENCOUNTER — Ambulatory Visit: Payer: Managed Care, Other (non HMO) | Admitting: Internal Medicine

## 2020-12-17 ENCOUNTER — Other Ambulatory Visit: Payer: Self-pay

## 2020-12-17 VITALS — BP 100/65 | HR 55 | Temp 99.1°F | Ht 62.52 in | Wt 134.4 lb

## 2020-12-17 DIAGNOSIS — Z13220 Encounter for screening for lipoid disorders: Secondary | ICD-10-CM | POA: Diagnosis not present

## 2020-12-17 DIAGNOSIS — R569 Unspecified convulsions: Secondary | ICD-10-CM

## 2020-12-17 DIAGNOSIS — R0789 Other chest pain: Secondary | ICD-10-CM

## 2020-12-17 DIAGNOSIS — Z1329 Encounter for screening for other suspected endocrine disorder: Secondary | ICD-10-CM

## 2020-12-17 NOTE — Progress Notes (Signed)
BP 100/65   Pulse (!) 55   Temp 99.1 F (37.3 C) (Oral)   Ht 5' 2.52" (1.588 m)   Wt 134 lb 6.4 oz (61 kg)   LMP 01/31/2017 (Approximate)   SpO2 98%   BMI 24.17 kg/m    Subjective:    Patient ID: Krista Meyer, female    DOB: 1970/06/22, 50 y.o.   MRN: [id number]  Chief Complaint  Patient presents with   Mitral Valve Prolapse   Depression    HPI: Krista Meyer is a 50 y.o. female  Pt is here for a fu, seen by cards for an   Shortness of Breath This is a chronic (ho MVP ECHO wnl per Dr. Jerrye Beavers.) problem.  Depression        This is a chronic problem. Seizures  This is a chronic problem. The current episode started more than 1 week ago.  Chief Complaint  Patient presents with   Mitral Valve Prolapse   Depression    Relevant past medical, surgical, family and social history reviewed and updated as indicated. Interim medical history since our last visit reviewed. Allergies and medications reviewed and updated.  Review of Systems  Respiratory:  Positive for shortness of breath.   Neurological:  Positive for seizures.  Psychiatric/Behavioral:  Positive for depression.    Per HPI unless specifically indicated above     Objective:    BP 100/65   Pulse (!) 55   Temp 99.1 F (37.3 C) (Oral)   Ht 5' 2.52" (1.588 m)   Wt 134 lb 6.4 oz (61 kg)   LMP 01/31/2017 (Approximate)   SpO2 98%   BMI 24.17 kg/m   Wt Readings from Last 3 Encounters:  12/17/20 134 lb 6.4 oz (61 kg)  09/17/20 131 lb 9.6 oz (59.7 kg)  08/27/20 135 lb (61.2 kg)    Physical Exam Vitals and nursing note reviewed.  Constitutional:      General: She is not in acute distress.    Appearance: Normal appearance. She is not ill-appearing or diaphoretic.  HENT:     Head: Normocephalic and atraumatic.     Right Ear: Tympanic membrane and external ear normal. There is no impacted cerumen.     Left Ear: External ear normal.     Nose: No congestion or rhinorrhea.     Mouth/Throat:      Pharynx: No oropharyngeal exudate or posterior oropharyngeal erythema.  Eyes:     Conjunctiva/sclera: Conjunctivae normal.     Pupils: Pupils are equal, round, and reactive to light.  Cardiovascular:     Rate and Rhythm: Normal rate and regular rhythm.     Heart sounds: No murmur heard.   No friction rub. No gallop.  Pulmonary:     Effort: No respiratory distress.     Breath sounds: No stridor. No wheezing or rhonchi.  Chest:     Chest wall: No tenderness.  Abdominal:     General: Abdomen is flat. Bowel sounds are normal. There is no distension.     Palpations: Abdomen is soft. There is no mass.     Tenderness: There is no abdominal tenderness. There is no guarding.  Musculoskeletal:        General: No swelling or deformity.     Cervical back: Normal range of motion and neck supple. No rigidity or tenderness.     Right lower leg: No edema.     Left lower leg: No edema.  Skin:    General:  Skin is warm and dry.     Coloration: Skin is not jaundiced.     Findings: No erythema.  Neurological:     Mental Status: She is alert and oriented to person, place, and time. Mental status is at baseline.  Psychiatric:        Mood and Affect: Mood normal.        Behavior: Behavior normal.        Thought Content: Thought content normal.        Judgment: Judgment normal.    Results for orders placed or performed in visit on 12/10/20  Lipid panel  Result Value Ref Range   Cholesterol, Total 189 100 - 199 mg/dL   Triglycerides 68 0 - 149 mg/dL   HDL 58 >39 mg/dL   VLDL Cholesterol Cal 13 5 - 40 mg/dL   LDL Chol Calc (NIH) 118 (H) 0 - 99 mg/dL   Chol/HDL Ratio 3.3 0.0 - 4.4 ratio  CBC with Differential/Platelet  Result Value Ref Range   WBC 5.9 3.4 - 10.8 x10E3/uL   RBC 4.07 3.77 - 5.28 x10E6/uL   Hemoglobin 12.8 11.1 - 15.9 g/dL   Hematocrit 37.7 34.0 - 46.6 %   MCV 93 79 - 97 fL   MCH 31.4 26.6 - 33.0 pg   MCHC 34.0 31.5 - 35.7 g/dL   RDW 11.5 (L) 11.7 - 15.4 %   Platelets 243  150 - 450 x10E3/uL   Neutrophils 66 Not Estab. %   Lymphs 25 Not Estab. %   Monocytes 6 Not Estab. %   Eos 2 Not Estab. %   Basos 1 Not Estab. %   Neutrophils Absolute 3.9 1.4 - 7.0 x10E3/uL   Lymphocytes Absolute 1.5 0.7 - 3.1 x10E3/uL   Monocytes Absolute 0.4 0.1 - 0.9 x10E3/uL   EOS (ABSOLUTE) 0.1 0.0 - 0.4 x10E3/uL   Basophils Absolute 0.1 0.0 - 0.2 x10E3/uL   Immature Granulocytes 0 Not Estab. %   Immature Grans (Abs) 0.0 0.0 - 0.1 x10E3/uL  Thyroid Panel With TSH  Result Value Ref Range   TSH 0.681 0.450 - 4.500 uIU/mL   T4, Total 7.6 4.5 - 12.0 ug/dL   T3 Uptake Ratio 30 24 - 39 %   Free Thyroxine Index 2.3 1.2 - 4.9  Comprehensive metabolic panel  Result Value Ref Range   Glucose 84 65 - 99 mg/dL   BUN 7 6 - 24 mg/dL   Creatinine, Ser 0.65 0.57 - 1.00 mg/dL   eGFR 107 >59 mL/min/1.73   BUN/Creatinine Ratio 11 9 - 23   Sodium 139 134 - 144 mmol/L   Potassium 4.2 3.5 - 5.2 mmol/L   Chloride 103 96 - 106 mmol/L   CO2 24 20 - 29 mmol/L   Calcium 8.7 8.7 - 10.2 mg/dL   Total Protein 6.8 6.0 - 8.5 g/dL   Albumin 4.3 3.8 - 4.8 g/dL   Globulin, Total 2.5 1.5 - 4.5 g/dL   Albumin/Globulin Ratio 1.7 1.2 - 2.2   Bilirubin Total 0.5 0.0 - 1.2 mg/dL   Alkaline Phosphatase 47 44 - 121 IU/L   AST 11 0 - 40 IU/L   ALT 7 0 - 32 IU/L        Current Outpatient Medications:    acetaminophen (TYLENOL) 325 MG tablet, Take 325 mg by mouth every 6 (six) hours as needed for pain (prn)., Disp: , Rfl:    Desvenlafaxine Succinate ER 25 MG TB24, Take 1 tablet by mouth daily., Disp: ,  Rfl:    docusate sodium (COLACE) 100 MG capsule, Take 1 capsule (100 mg total) by mouth 2 (two) times daily., Disp: 30 capsule, Rfl: 2   levETIRAcetam (KEPPRA XR) 500 MG 24 hr tablet, TAKE 1 TABLET BY MOUTH TWO  TIMES DAILY, Disp: 180 tablet, Rfl: 0    Assessment & Plan:  HLD : recheck FLP, check LFT's work on diet, SE of meds explained to pt. low fat and high fiber diet explained to pt.  2. Seizures : is  on keppra for such  Stable fu with Neurology for such  Fu and mx per them  3. intermittent chest pain / SOBOE has a ho MVP ECHO wnl per Dr. Jerrye Beavers.  To have a nuclear stress test , had some abnl on exercise stress test, will need to fu with Cards for such   4. Consitpaiton chronic stable, is on colace for such continue  Will start pt on omeprazole.  ADVISED TO start pt on miralax.  increase fiber in diet.  Increase vegetables in diet.  Increase water to 8 - 10 glasses a day  5. Ho MVP fu and mx per cards.   Problem List Items Addressed This Visit   None   No orders of the defined types were placed in this encounter.  No orders of the defined types were placed in this encounter.    Follow up plan: No follow-ups on file.

## 2021-05-26 ENCOUNTER — Other Ambulatory Visit: Payer: Self-pay

## 2021-05-26 ENCOUNTER — Ambulatory Visit: Payer: Managed Care, Other (non HMO) | Attending: Internal Medicine

## 2021-05-26 DIAGNOSIS — Z23 Encounter for immunization: Secondary | ICD-10-CM

## 2021-05-26 MED ORDER — PFIZER COVID-19 VAC BIVALENT 30 MCG/0.3ML IM SUSP
INTRAMUSCULAR | 0 refills | Status: DC
  Filled 2021-05-26: qty 0.3, 1d supply, fill #0

## 2021-05-26 NOTE — Progress Notes (Signed)
° °  Covid-19 Vaccination Clinic  Name:  Krista Meyer    MRN: [id number] DOB: 1971/02/26  05/26/2021  [name] was observed post Covid-19 immunization for 15 minutes without incident. She was provided with Vaccine Information Sheet and instruction to access the V-Safe system.   [name] was instructed to call 911 with any severe reactions post vaccine: Difficulty breathing  Swelling of face and throat  A fast heartbeat  A bad rash all over body  Dizziness and weakness   Immunizations Administered     Name Date Dose VIS Date Route   Pfizer Covid-19 Vaccine Bivalent Booster 05/26/2021 12:13 PM 0.3 mL 01/26/2021 Intramuscular   Manufacturer: [place]   Lot: [id number]   [place]: [phone]

## 2021-06-17 ENCOUNTER — Other Ambulatory Visit: Payer: Managed Care, Other (non HMO)

## 2021-06-24 ENCOUNTER — Ambulatory Visit: Payer: Managed Care, Other (non HMO) | Admitting: Internal Medicine

## 2021-07-18 ENCOUNTER — Ambulatory Visit: Payer: Self-pay | Admitting: *Deleted

## 2021-07-18 NOTE — Telephone Encounter (Signed)
°  Chief Complaint: possible boil on left side of neck Symptoms: sore, painful, has a head, left side of neck sore Frequency: last week. Pertinent Negatives: Patient denies Fever or having boils before Disposition: [] ED /[] Urgent Care (no appt availability in office) / [x] Appointment(In office/virtual)/ []  [facility]/ [] Home Care/ [] Refused Recommended Disposition /[]  Mobile Bus/ []  Follow-up with PCP Additional Notes:

## 2021-07-18 NOTE — Telephone Encounter (Signed)
Reason for Disposition  [1] Spreading redness around the boil AND [2] no fever  Answer Assessment - Initial Assessment Questions 1. APPEARANCE of BOIL: "What does the boil look like?"      She is c/o a boil on the side of her neck.   It started as a rash. 2. LOCATION: "Where is the boil located?"      Towards the left nape of my neck.    I had a haircut last week and they used a razor on my neck.   It itches or hurts.   That side of my neck is sore. 3. NUMBER: "How many boils are there?"      1 4. SIZE: "How big is the boil?" (e.g., inches, cm; compare to size of a coin or other object)     *No Answer* 5. ONSET: "When did the boil start?"     Last week I was broke out.   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    But the necklaces weren't metal. 6. PAIN: "Is there any pain?" If Yes, ask: "How bad is the pain?"   (Scale 1-10; or mild, moderate, severe)     It's sore 7. FEVER: "Do you have a fever?" If Yes, ask: "What is it, how was it measured, and when did it start?"      *No Answer* 8. SOURCE: "Have you been around anyone with boils or other Staph infections?" "Have you ever had boils before?"     *No Answer* 9. OTHER SYMPTOMS: "Do you have any other symptoms?" (e.g., shaking chills, weakness, rash elsewhere on body)     *No Answer* 10. PREGNANCY: "Is there any chance you are pregnant?" "When was your last menstrual period?"       *No Answer*  Protocols used: Boil (Skin Abscess)-A-AH

## 2021-07-19 ENCOUNTER — Other Ambulatory Visit: Payer: Self-pay

## 2021-07-19 ENCOUNTER — Ambulatory Visit: Payer: Managed Care, Other (non HMO) | Admitting: Internal Medicine

## 2021-07-19 ENCOUNTER — Encounter: Payer: Self-pay | Admitting: Internal Medicine

## 2021-07-19 VITALS — BP 109/76 | HR 75 | Temp 98.4°F | Ht 62.52 in | Wt 134.8 lb

## 2021-07-19 DIAGNOSIS — B029 Zoster without complications: Secondary | ICD-10-CM | POA: Diagnosis not present

## 2021-07-19 MED ORDER — TRIAMCINOLONE ACETONIDE 0.1 % EX OINT: 1.0000 "application " | TOPICAL_OINTMENT | Freq: Two times a day (BID) | CUTANEOUS | 1 refills | Status: DC

## 2021-07-19 MED ORDER — ACYCLOVIR 5 % EX OINT: 1.0000 "application " | TOPICAL_OINTMENT | Freq: Three times a day (TID) | CUTANEOUS | 1 refills | Status: DC

## 2021-07-19 MED ORDER — METHYLPREDNISOLONE 4 MG PO TBPK: ORAL_TABLET | ORAL | 0 refills | Status: DC

## 2021-07-19 MED ORDER — VALACYCLOVIR HCL 1 G PO TABS: 1000.0000 mg | ORAL_TABLET | Freq: Three times a day (TID) | ORAL | 0 refills | Status: AC

## 2021-07-19 MED ORDER — PREGABALIN 50 MG PO CAPS: 50.0000 mg | ORAL_CAPSULE | Freq: Every evening | ORAL | 3 refills | Status: DC

## 2021-07-19 NOTE — Progress Notes (Signed)
BP 109/76    Pulse 75    Temp 98.4 F (36.9 C) (Oral)    Ht 5' 2.52" (1.588 m)    Wt 134 lb 12.8 oz (61.1 kg)    LMP 01/31/2017 (Approximate)    SpO2 97%    BMI 24.25 kg/m    Subjective:    Patient ID: Krista Meyer, female    DOB: 1971/04/01, 51 y.o.   MRN: [id number]  Chief Complaint  Patient presents with   Rash    Pain started before rash for the past week then the rash(blisters) came this weekend.     HPI: Krista Meyer is a 51 y.o. female  3. SHINGLES Duration:  Location:    Painful:  yes Severity: 7/10  Paresthesia:  no Hyperesthesia: no Itching:  yes Burning:  no Oozing:  no Blisters:  yes Fevers:  no History of the same:  no Status: better, worse, stable, and fluctuating    Chief Complaint  Patient presents with   Rash    Pain started before rash for the past week then the rash(blisters) came this weekend.     Relevant past medical, surgical, family and social history reviewed and updated as indicated. Interim medical history since our last visit reviewed. Allergies and medications reviewed and updated.  Review of Systems  Per HPI unless specifically indicated above     Objective:    BP 109/76    Pulse 75    Temp 98.4 F (36.9 C) (Oral)    Ht 5' 2.52" (1.588 m)    Wt 134 lb 12.8 oz (61.1 kg)    LMP 01/31/2017 (Approximate)    SpO2 97%    BMI 24.25 kg/m   Wt Readings from Last 3 Encounters:  07/19/21 134 lb 12.8 oz (61.1 kg)  12/17/20 134 lb 6.4 oz (61 kg)  09/17/20 131 lb 9.6 oz (59.7 kg)    Physical Exam Vitals and nursing note reviewed.  Constitutional:      General: She is not in acute distress.    Appearance: Normal appearance. She is not ill-appearing or diaphoretic.  Pulmonary:     Breath sounds: No rhonchi.  Musculoskeletal:        General: Tenderness present. No swelling.  Skin:    General: Skin is warm and dry.     Coloration: Skin is not jaundiced.     Findings: Lesion and rash present. No erythema.   Neurological:     Mental Status: She is alert.          Current Outpatient Medications:    acetaminophen (TYLENOL) 325 MG tablet, Take 325 mg by mouth every 6 (six) hours as needed for pain (prn)., Disp: , Rfl:    Desvenlafaxine Succinate ER 25 MG TB24, Take 1 tablet by mouth daily., Disp: , Rfl:    docusate sodium (COLACE) 100 MG capsule, Take 1 capsule (100 mg total) by mouth 2 (two) times daily., Disp: 30 capsule, Rfl: 2   levETIRAcetam (KEPPRA XR) 500 MG 24 hr tablet, TAKE 1 TABLET BY MOUTH TWO  TIMES DAILY, Disp: 180 tablet, Rfl: 0   methylPREDNISolone (MEDROL DOSEPAK) 4 MG TBPK tablet, Use as directed, Disp: 1 each, Rfl: 0   OXcarbazepine (TRILEPTAL) 150 MG tablet, oxcarbazepine 150 mg tablet, Disp: , Rfl:    pregabalin (LYRICA) 50 MG capsule, Take 1 capsule (50 mg total) by mouth every evening., Disp: 30 capsule, Rfl: 3   triamcinolone ointment (KENALOG) 0.1 %, Apply 1 application topically 2 (two)  times daily., Disp: 30 g, Rfl: 1   valACYclovir (VALTREX) 1000 MG tablet, Take 1 tablet (1,000 mg total) by mouth 3 (three) times daily for 7 days., Disp: 21 tablet, Rfl: 0    Assessment & Plan:  Shingles on the left side of the neck/ around the chest wall on the left lateral part of such Will start pt on lyrica for pain / start valtrex 1000 mg tid x 7 days, Medrol dose pak and acyclovir topical ointment to keep area covered .  Problem List Items Addressed This Visit       Other   Herpes zoster without complication - Primary   Relevant Medications   valACYclovir (VALTREX) 1000 MG tablet     No orders of the defined types were placed in this encounter.    Meds ordered this encounter  Medications   valACYclovir (VALTREX) 1000 MG tablet    Sig: Take 1 tablet (1,000 mg total) by mouth 3 (three) times daily for 7 days.    Dispense:  21 tablet    Refill:  0   methylPREDNISolone (MEDROL DOSEPAK) 4 MG TBPK tablet    Sig: Use as directed    Dispense:  1 each    Refill:  0    triamcinolone ointment (KENALOG) 0.1 %    Sig: Apply 1 application topically 2 (two) times daily.    Dispense:  30 g    Refill:  1   pregabalin (LYRICA) 50 MG capsule    Sig: Take 1 capsule (50 mg total) by mouth every evening.    Dispense:  30 capsule    Refill:  3     Follow up plan: Return in about 2 weeks (around 08/02/2021).

## 2021-08-02 ENCOUNTER — Ambulatory Visit: Payer: Managed Care, Other (non HMO) | Admitting: Internal Medicine

## 2021-08-02 ENCOUNTER — Other Ambulatory Visit: Payer: Self-pay

## 2021-08-02 VITALS — BP 105/72 | HR 82 | Temp 97.8°F | Ht 62.52 in | Wt 132.8 lb

## 2021-08-02 DIAGNOSIS — B028 Zoster with other complications: Secondary | ICD-10-CM | POA: Diagnosis not present

## 2021-08-02 LAB — URINALYSIS, ROUTINE W REFLEX MICROSCOPIC
Bilirubin, UA: NEGATIVE
Glucose, UA: NEGATIVE
Ketones, UA: NEGATIVE
Leukocytes,UA: NEGATIVE
Nitrite, UA: NEGATIVE
Protein,UA: NEGATIVE
RBC, UA: NEGATIVE
Specific Gravity, UA: 1.025 (ref 1.005–1.030)
Urobilinogen, Ur: 0.2 mg/dL (ref 0.2–1.0)
pH, UA: 5.5 (ref 5.0–7.5)

## 2021-08-02 LAB — BAYER DCA HB A1C WAIVED: HB A1C (BAYER DCA - WAIVED): 5.1 % (ref 4.8–5.6)

## 2021-08-02 NOTE — Progress Notes (Signed)
? ?BP 105/72   Pulse 82   Temp 97.8 ?F (36.6 ?C) (Oral)   Ht 5' 2.52" (1.588 m)   Wt 132 lb 12.8 oz (60.2 kg)   LMP 01/31/2017 (Approximate)   SpO2 98%   BMI 23.89 kg/m?   ? ?Subjective:  ? ? Patient ID: Krista Meyer, female    DOB: 07/11/1970, 50 y.o.   MRN: [id number] ? ?Chief Complaint  ?Patient presents with  ? Rash  ? ? ?HPI: ?Krista Meyer is a 51 y.o. female ? ?Rash ?This is a new (rash seems improved x 2 week ago, rash is better now still erythema present.) problem. Episode onset: stopped lyrica x  took only for one week stopped taking. The rash is characterized by redness. Associated symptoms include fatigue. Pertinent negatives include no anorexia, congestion, cough, diarrhea, eye pain, facial edema, fever, joint pain, nail changes, rhinorrhea, shortness of breath, sore throat or vomiting.  ? ?Chief Complaint  ?Patient presents with  ? Rash  ? ? ?Relevant past medical, surgical, family and social history reviewed and updated as indicated. Interim medical history since our last visit reviewed. ?Allergies and medications reviewed and updated. ? ?Review of Systems  ?Constitutional:  Positive for fatigue. Negative for fever.  ?HENT:  Negative for congestion, rhinorrhea and sore throat.   ?Eyes:  Negative for pain.  ?Respiratory:  Negative for cough and shortness of breath.   ?Gastrointestinal:  Negative for anorexia, diarrhea and vomiting.  ?Musculoskeletal:  Negative for joint pain.  ?Skin:  Positive for rash. Negative for nail changes.  ? ?Per HPI unless specifically indicated above ? ?   ?Objective:  ?  ?BP 105/72   Pulse 82   Temp 97.8 ?F (36.6 ?C) (Oral)   Ht 5' 2.52" (1.588 m)   Wt 132 lb 12.8 oz (60.2 kg)   LMP 01/31/2017 (Approximate)   SpO2 98%   BMI 23.89 kg/m?   ?Wt Readings from Last 3 Encounters:  ?08/02/21 132 lb 12.8 oz (60.2 kg)  ?07/19/21 134 lb 12.8 oz (61.1 kg)  ?12/17/20 134 lb 6.4 oz (61 kg)  ?  ?Physical Exam ?Vitals and nursing note reviewed.  ?Constitutional:    ?   General: She is not in acute distress. ?   Appearance: Normal appearance. She is not ill-appearing or diaphoretic.  ?Eyes:  ?   Conjunctiva/sclera: Conjunctivae normal.  ?Cardiovascular:  ?   Rate and Rhythm: Normal rate and regular rhythm.  ?Pulmonary:  ?   Breath sounds: No rhonchi.  ?Skin: ?   General: Skin is dry.  ?   Coloration: Skin is not jaundiced.  ?   Findings: Rash present. No erythema.  ?   Comments: Rash is better  ?Neurological:  ?   Mental Status: She is alert.  ? ? ?Results for orders placed or performed in visit on 08/02/21  ?CBC with Differential/Platelet  ?Result Value Ref Range  ? WBC 6.3 3.4 - 10.8 x10E3/uL  ? RBC 4.48 3.77 - 5.28 x10E6/uL  ? Hemoglobin 13.9 11.1 - 15.9 g/dL  ? Hematocrit 40.6 34.0 - 46.6 %  ? MCV 91 79 - 97 fL  ? MCH 31.0 26.6 - 33.0 pg  ? MCHC 34.2 31.5 - 35.7 g/dL  ? RDW 12.3 11.7 - 15.4 %  ? Platelets 260 150 - 450 x10E3/uL  ? Neutrophils 55 Not Estab. %  ? Lymphs 35 Not Estab. %  ? Monocytes 6 Not Estab. %  ? Eos 3 Not Estab. %  ?  Basos 1 Not Estab. %  ? Neutrophils Absolute 3.5 1.4 - 7.0 x10E3/uL  ? Lymphocytes Absolute 2.2 0.7 - 3.1 x10E3/uL  ? Monocytes Absolute 0.4 0.1 - 0.9 x10E3/uL  ? EOS (ABSOLUTE) 0.2 0.0 - 0.4 x10E3/uL  ? Basophils Absolute 0.1 0.0 - 0.2 x10E3/uL  ? Immature Granulocytes 0 Not Estab. %  ? Immature Grans (Abs) 0.0 0.0 - 0.1 x10E3/uL  ?Comprehensive metabolic panel  ?Result Value Ref Range  ? Glucose 118 (H) 70 - 99 mg/dL  ? BUN 11 6 - 24 mg/dL  ? Creatinine, Ser 0.65 0.57 - 1.00 mg/dL  ? eGFR 107 >59 mL/min/1.73  ? BUN/Creatinine Ratio 17 9 - 23  ? Sodium 136 134 - 144 mmol/L  ? Potassium 3.7 3.5 - 5.2 mmol/L  ? Chloride 97 96 - 106 mmol/L  ? CO2 25 20 - 29 mmol/L  ? Calcium 9.3 8.7 - 10.2 mg/dL  ? Total Protein 7.0 6.0 - 8.5 g/dL  ? Albumin 4.5 3.8 - 4.8 g/dL  ? Globulin, Total 2.5 1.5 - 4.5 g/dL  ? Albumin/Globulin Ratio 1.8 1.2 - 2.2  ? Bilirubin Total 0.3 0.0 - 1.2 mg/dL  ? Alkaline Phosphatase 53 44 - 121 IU/L  ? AST 13 0 - 40 IU/L  ?  ALT 11 0 - 32 IU/L  ?Urinalysis, Routine w reflex microscopic  ?Result Value Ref Range  ? Specific Gravity, UA 1.025 1.005 - 1.030  ? pH, UA 5.5 5.0 - 7.5  ? Color, UA Yellow Yellow  ? Appearance Ur Clear Clear  ? Leukocytes,UA Negative Negative  ? Protein,UA Negative Negative/Trace  ? Glucose, UA Negative Negative  ? Ketones, UA Negative Negative  ? RBC, UA Negative Negative  ? Bilirubin, UA Negative Negative  ? Urobilinogen, Ur 0.2 0.2 - 1.0 mg/dL  ? Nitrite, UA Negative Negative  ?TSH  ?Result Value Ref Range  ? TSH 0.779 0.450 - 4.500 uIU/mL  ?Bayer DCA Hb A1c Waived (STAT)  ?Result Value Ref Range  ? HB A1C (BAYER DCA - WAIVED) 5.1 4.8 - 5.6 %  ? ?   ? ? ?Current Outpatient Medications:  ?  acetaminophen (TYLENOL) 325 MG tablet, Take 325 mg by mouth every 6 (six) hours as needed for pain (prn)., Disp: , Rfl:  ?  acyclovir ointment (ZOVIRAX) 5 %, Apply 1 application topically in the morning, at noon, and at bedtime., Disp: 15 g, Rfl: 1 ?  Desvenlafaxine Succinate ER 25 MG TB24, Take 1 tablet by mouth daily., Disp: , Rfl:  ?  docusate sodium (COLACE) 100 MG capsule, Take 1 capsule (100 mg total) by mouth 2 (two) times daily., Disp: 30 capsule, Rfl: 2 ?  levETIRAcetam (KEPPRA XR) 500 MG 24 hr tablet, TAKE 1 TABLET BY MOUTH TWO  TIMES DAILY, Disp: 180 tablet, Rfl: 0 ?  methylPREDNISolone (MEDROL DOSEPAK) 4 MG TBPK tablet, Use as directed, Disp: 1 each, Rfl: 0 ?  OXcarbazepine (TRILEPTAL) 150 MG tablet, oxcarbazepine 150 mg tablet, Disp: , Rfl:  ?  pregabalin (LYRICA) 50 MG capsule, Take 1 capsule (50 mg total) by mouth every evening., Disp: 30 capsule, Rfl: 3 ?  triamcinolone ointment (KENALOG) 0.1 %, Apply topically 2 (two) times daily., Disp: , Rfl:   ? ? ?Assessment & Plan:  ?Shingles check a1c today  ?Pt better symptoms improved on current regimen. ?Is on lyrica for pain  ? ?Problem List Items Addressed This Visit   ? ?  ? Other  ? Herpes zoster with complication - Primary  ?  Relevant Orders  ? CBC with  Differential/Platelet (Completed)  ? Comprehensive metabolic panel (Completed)  ? Urinalysis, Routine w reflex microscopic (Completed)  ? TSH (Completed)  ? Bayer DCA Hb A1c Waived (STAT) (Completed)  ?  ? ?Orders Placed This Encounter  ?Procedures  ? CBC with Differential/Platelet  ? Comprehensive metabolic panel  ? Urinalysis, Routine w reflex microscopic  ? TSH  ? Bayer DCA Hb A1c Waived (STAT)  ?  ? ?No orders of the defined types were placed in this encounter. ?  ? ?Follow up plan: ?Return in about 6 months (around 02/02/2022). ? ?

## 2021-08-03 LAB — COMPREHENSIVE METABOLIC PANEL
ALT: 11 IU/L (ref 0–32)
AST: 13 IU/L (ref 0–40)
Albumin/Globulin Ratio: 1.8 (ref 1.2–2.2)
Albumin: 4.5 g/dL (ref 3.8–4.8)
Alkaline Phosphatase: 53 IU/L (ref 44–121)
BUN/Creatinine Ratio: 17 (ref 9–23)
BUN: 11 mg/dL (ref 6–24)
Bilirubin Total: 0.3 mg/dL (ref 0.0–1.2)
CO2: 25 mmol/L (ref 20–29)
Calcium: 9.3 mg/dL (ref 8.7–10.2)
Chloride: 97 mmol/L (ref 96–106)
Creatinine, Ser: 0.65 mg/dL (ref 0.57–1.00)
Globulin, Total: 2.5 g/dL (ref 1.5–4.5)
Glucose: 118 mg/dL — ABNORMAL HIGH (ref 70–99)
Potassium: 3.7 mmol/L (ref 3.5–5.2)
Sodium: 136 mmol/L (ref 134–144)
Total Protein: 7 g/dL (ref 6.0–8.5)
eGFR: 107 mL/min/{1.73_m2} (ref >59)

## 2021-08-03 LAB — CBC WITH DIFFERENTIAL/PLATELET
Basophils Absolute: 0.1 10*3/uL (ref 0.0–0.2)
Basos: 1 %
EOS (ABSOLUTE): 0.2 10*3/uL (ref 0.0–0.4)
Eos: 3 %
Hematocrit: 40.6 % (ref 34.0–46.6)
Hemoglobin: 13.9 g/dL (ref 11.1–15.9)
Immature Grans (Abs): 0 10*3/uL (ref 0.0–0.1)
Immature Granulocytes: 0 %
Lymphocytes Absolute: 2.2 10*3/uL (ref 0.7–3.1)
Lymphs: 35 %
MCH: 31 pg (ref 26.6–33.0)
MCHC: 34.2 g/dL (ref 31.5–35.7)
MCV: 91 fL (ref 79–97)
Monocytes Absolute: 0.4 10*3/uL (ref 0.1–0.9)
Monocytes: 6 %
Neutrophils Absolute: 3.5 10*3/uL (ref 1.4–7.0)
Neutrophils: 55 %
Platelets: 260 10*3/uL (ref 150–450)
RBC: 4.48 x10E6/uL (ref 3.77–5.28)
RDW: 12.3 % (ref 11.7–15.4)
WBC: 6.3 10*3/uL (ref 3.4–10.8)

## 2021-08-03 LAB — TSH: TSH: 0.779 u[IU]/mL (ref 0.450–4.500)

## 2021-10-26 ENCOUNTER — Encounter: Payer: Self-pay | Admitting: Internal Medicine

## 2021-10-26 ENCOUNTER — Ambulatory Visit: Payer: Managed Care, Other (non HMO) | Admitting: Internal Medicine

## 2021-10-26 VITALS — BP 112/75 | HR 63 | Temp 98.3°F | Ht 62.52 in | Wt 142.2 lb

## 2021-10-26 DIAGNOSIS — R319 Hematuria, unspecified: Secondary | ICD-10-CM | POA: Diagnosis not present

## 2021-10-26 LAB — URINALYSIS, ROUTINE W REFLEX MICROSCOPIC
Bilirubin, UA: NEGATIVE
Glucose, UA: NEGATIVE
Ketones, UA: NEGATIVE
Nitrite, UA: NEGATIVE
RBC, UA: NEGATIVE
Specific Gravity, UA: >1.03 — ABNORMAL HIGH (ref 1.005–1.030)
Urobilinogen, Ur: 0.2 mg/dL (ref 0.2–1.0)
pH, UA: 6 (ref 5.0–7.5)

## 2021-10-26 LAB — MICROSCOPIC EXAMINATION
Bacteria, UA: NONE SEEN
RBC, Urine: NONE SEEN /HPF (ref 0–2)

## 2021-10-26 NOTE — Patient Instructions (Signed)

## 2021-10-26 NOTE — Progress Notes (Signed)
BP 112/75   Pulse 63   Temp 98.3 F (36.8 C) (Oral)   Ht 5' 2.52" (1.588 m)   Wt 142 lb 3.2 oz (64.5 kg)   LMP 01/31/2017 (Approximate)   SpO2 99%   BMI 25.58 kg/m    Subjective:    Patient ID: Krista Meyer, female    DOB: 17-Jun-1970, 51 y.o.   MRN: [id number]  Chief Complaint  Patient presents with   Hematuria    Started last night     HPI: Krista Meyer is a 51 y.o. female  Hematuria This is a new (lmp - as had a D & C 4 yrs ago hasnt had a peirod since then) problem. The current episode started yesterday (started yesterday x 1 none after then). The problem has been waxing and waning (the day before she felt a little irritated in the vaginal area) since onset. Severity: had some cramping. Irritative symptoms do not include frequency, nocturia or urgency. Obstructive symptoms do not include an intermittent stream. Pertinent negatives include no abdominal pain, bladder pain, bone pain, chills, dysuria, facial swelling, fever, flank pain, genital pain, hematospermia, hesitancy, inability to urinate, nausea, urinary retention or weight loss.   Chief Complaint  Patient presents with   Hematuria    Started last night     Relevant past medical, surgical, family and social history reviewed and updated as indicated. Interim medical history since our last visit reviewed. Allergies and medications reviewed and updated.  Review of Systems  Constitutional:  Negative for chills, fever and weight loss.  HENT:  Negative for facial swelling.   Gastrointestinal:  Negative for abdominal pain and nausea.  Genitourinary:  Positive for hematuria. Negative for dysuria, flank pain, frequency, hesitancy, nocturia and urgency.   Per HPI unless specifically indicated above     Objective:    BP 112/75   Pulse 63   Temp 98.3 F (36.8 C) (Oral)   Ht 5' 2.52" (1.588 m)   Wt 142 lb 3.2 oz (64.5 kg)   LMP 01/31/2017 (Approximate)   SpO2 99%   BMI 25.58 kg/m   Wt Readings  from Last 3 Encounters:  10/26/21 142 lb 3.2 oz (64.5 kg)  08/02/21 132 lb 12.8 oz (60.2 kg)  07/19/21 134 lb 12.8 oz (61.1 kg)    Physical Exam Vitals and nursing note reviewed.  Constitutional:      General: She is not in acute distress.    Appearance: Normal appearance. She is not ill-appearing or diaphoretic.  Eyes:     Conjunctiva/sclera: Conjunctivae normal.  Pulmonary:     Breath sounds: No rhonchi.  Abdominal:     General: Abdomen is flat. Bowel sounds are normal. There is no distension.     Palpations: Abdomen is soft. There is no mass.     Tenderness: There is no abdominal tenderness. There is no guarding.  Skin:    General: Skin is dry.     Coloration: Skin is not jaundiced.     Findings: No erythema.  Neurological:     Mental Status: She is alert.    Results for orders placed or performed in visit on 08/02/21  CBC with Differential/Platelet  Result Value Ref Range   WBC 6.3 3.4 - 10.8 x10E3/uL   RBC 4.48 3.77 - 5.28 x10E6/uL   Hemoglobin 13.9 11.1 - 15.9 g/dL   Hematocrit 40.6 34.0 - 46.6 %   MCV 91 79 - 97 fL   MCH 31.0 26.6 - 33.0 pg  MCHC 34.2 31.5 - 35.7 g/dL   RDW 12.3 11.7 - 15.4 %   Platelets 260 150 - 450 x10E3/uL   Neutrophils 55 Not Estab. %   Lymphs 35 Not Estab. %   Monocytes 6 Not Estab. %   Eos 3 Not Estab. %   Basos 1 Not Estab. %   Neutrophils Absolute 3.5 1.4 - 7.0 x10E3/uL   Lymphocytes Absolute 2.2 0.7 - 3.1 x10E3/uL   Monocytes Absolute 0.4 0.1 - 0.9 x10E3/uL   EOS (ABSOLUTE) 0.2 0.0 - 0.4 x10E3/uL   Basophils Absolute 0.1 0.0 - 0.2 x10E3/uL   Immature Granulocytes 0 Not Estab. %   Immature Grans (Abs) 0.0 0.0 - 0.1 x10E3/uL  Comprehensive metabolic panel  Result Value Ref Range   Glucose 118 (H) 70 - 99 mg/dL   BUN 11 6 - 24 mg/dL   Creatinine, Ser 0.65 0.57 - 1.00 mg/dL   eGFR 107 >59 mL/min/1.73   BUN/Creatinine Ratio 17 9 - 23   Sodium 136 134 - 144 mmol/L   Potassium 3.7 3.5 - 5.2 mmol/L   Chloride 97 96 - 106 mmol/L    CO2 25 20 - 29 mmol/L   Calcium 9.3 8.7 - 10.2 mg/dL   Total Protein 7.0 6.0 - 8.5 g/dL   Albumin 4.5 3.8 - 4.8 g/dL   Globulin, Total 2.5 1.5 - 4.5 g/dL   Albumin/Globulin Ratio 1.8 1.2 - 2.2   Bilirubin Total 0.3 0.0 - 1.2 mg/dL   Alkaline Phosphatase 53 44 - 121 IU/L   AST 13 0 - 40 IU/L   ALT 11 0 - 32 IU/L  Urinalysis, Routine w reflex microscopic  Result Value Ref Range   Specific Gravity, UA 1.025 1.005 - 1.030   pH, UA 5.5 5.0 - 7.5   Color, UA Yellow Yellow   Appearance Ur Clear Clear   Leukocytes,UA Negative Negative   Protein,UA Negative Negative/Trace   Glucose, UA Negative Negative   Ketones, UA Negative Negative   RBC, UA Negative Negative   Bilirubin, UA Negative Negative   Urobilinogen, Ur 0.2 0.2 - 1.0 mg/dL   Nitrite, UA Negative Negative  TSH  Result Value Ref Range   TSH 0.779 0.450 - 4.500 uIU/mL  Bayer DCA Hb A1c Waived (STAT)  Result Value Ref Range   HB A1C (BAYER DCA - WAIVED) 5.1 4.8 - 5.6 %        Current Outpatient Medications:    acetaminophen (TYLENOL) 325 MG tablet, Take 325 mg by mouth every 6 (six) hours as needed for pain (prn)., Disp: , Rfl:    acyclovir ointment (ZOVIRAX) 5 %, Apply 1 application topically in the morning, at noon, and at bedtime., Disp: 15 g, Rfl: 1   Desvenlafaxine Succinate ER 25 MG TB24, Take 1 tablet by mouth daily., Disp: , Rfl:    docusate sodium (COLACE) 100 MG capsule, Take 1 capsule (100 mg total) by mouth 2 (two) times daily., Disp: 30 capsule, Rfl: 2   OXcarbazepine (TRILEPTAL) 150 MG tablet, oxcarbazepine 150 mg tablet, Disp: , Rfl:    pregabalin (LYRICA) 50 MG capsule, Take 1 capsule (50 mg total) by mouth every evening., Disp: 30 capsule, Rfl: 3   triamcinolone ointment (KENALOG) 0.1 %, Apply topically 2 (two) times daily., Disp: , Rfl:     Assessment & Plan:  Hematuria vs vaginal bleeding. WILL NEED TO FU WITH OBGYN ASAP.  CT ABDOMEN 2021 IMPRESSION: No evidence of renal calculi or urinary tract  obstructive changes. Normal-appearing appendix.  Changes of uterine fibroids which may contribute to the patient's discomfort. Will check for nephrolithiais with a KUB  Consider urolgy referral if persists.    Problem List Items Addressed This Visit       Other   Hematuria - Primary   Relevant Orders   Urine Culture   Urinalysis, Routine w reflex microscopic   DG Abd 2 Views     Orders Placed This Encounter  Procedures   Urine Culture   DG Abd 2 Views   Urinalysis, Routine w reflex microscopic     No orders of the defined types were placed in this encounter.    Follow up plan: Return in about 2 weeks (around 11/09/2021).

## 2021-10-28 LAB — URINE CULTURE

## 2021-12-05 IMAGING — CT CT RENAL STONE PROTOCOL
2 of 4 series · 17 of 46 positions shown, 19 images · non-contrast
Comparison: 11/12/2008

CLINICAL DATA: Right-sided abdominal pain for 2 months

EXAM:
CT ABDOMEN AND PELVIS WITHOUT CONTRAST
TECHNIQUE: Multidetector CT imaging of the abdomen and pelvis was performed
following the standard protocol without IV contrast.

[Series 2: stone full standard · axial · 0.73mm/px · z∈[-499,-109]mm · 14 of 86 slices shown, 16 images]
[im 4/86  soft-tissue]
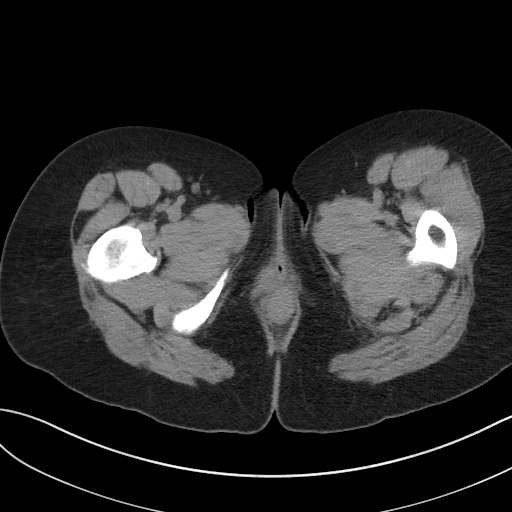
[im 4/86  bone]
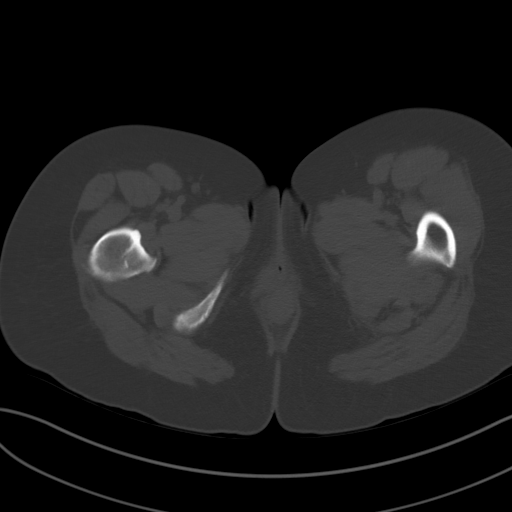
[im 10/86  soft-tissue]
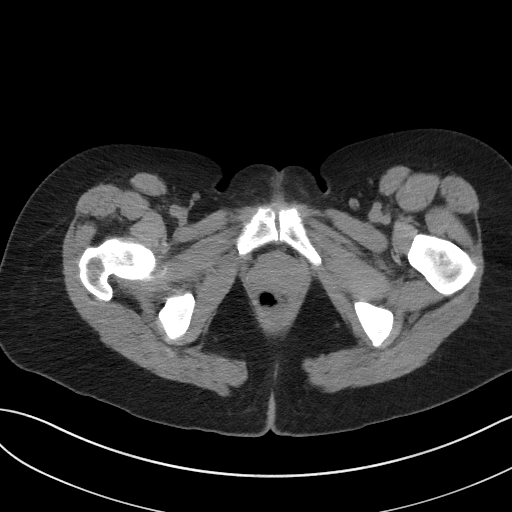
[im 17/86  soft-tissue]
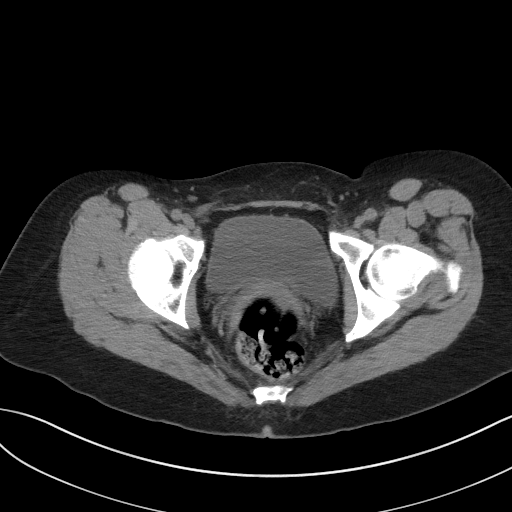
[im 23/86  soft-tissue]
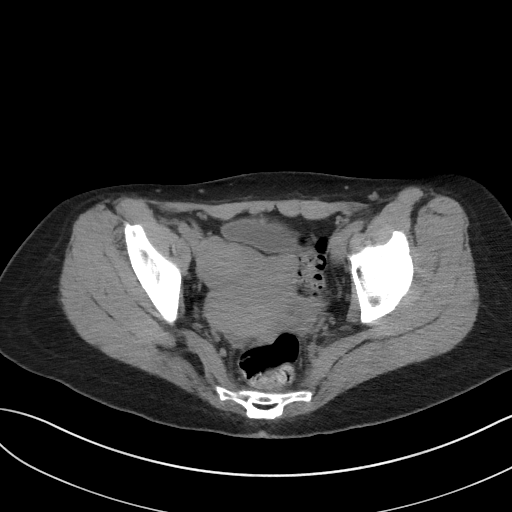
[im 30/86  soft-tissue]
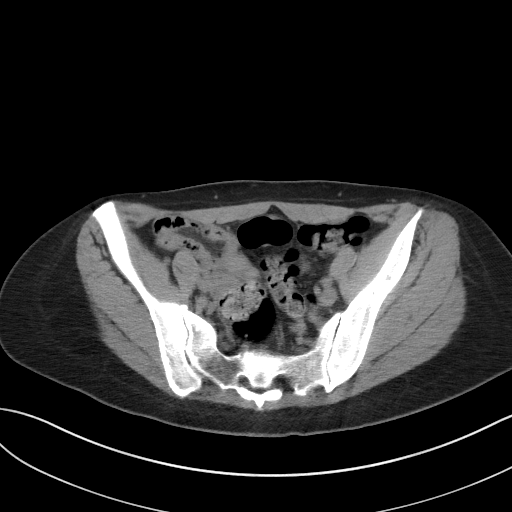
[im 33/86  soft-tissue]
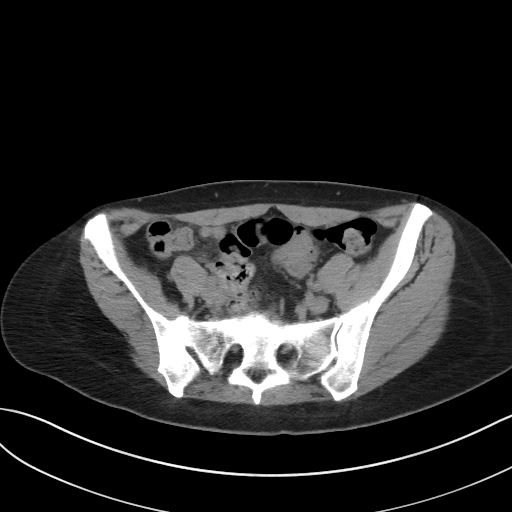
[im 40/86  soft-tissue]
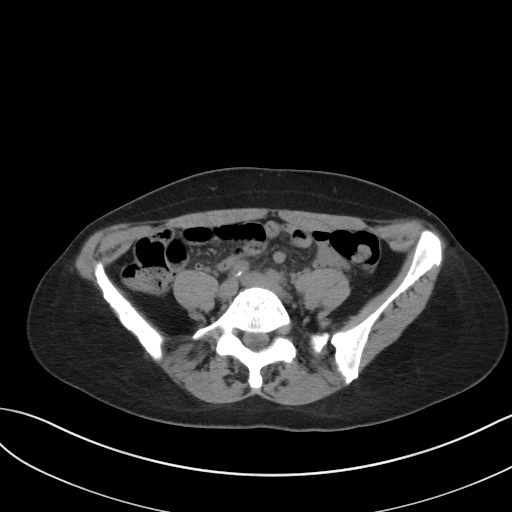
[im 46/86  soft-tissue]
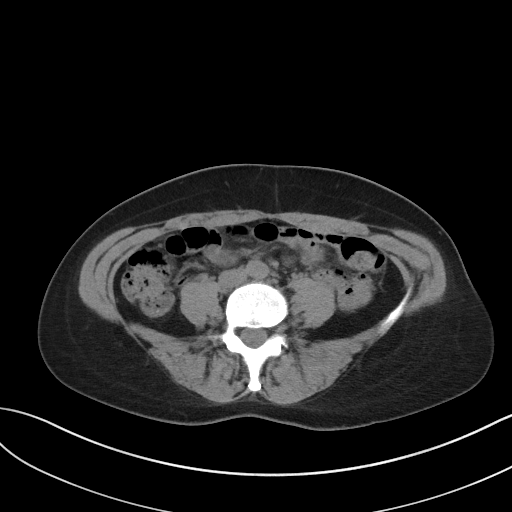
[im 53/86  soft-tissue]
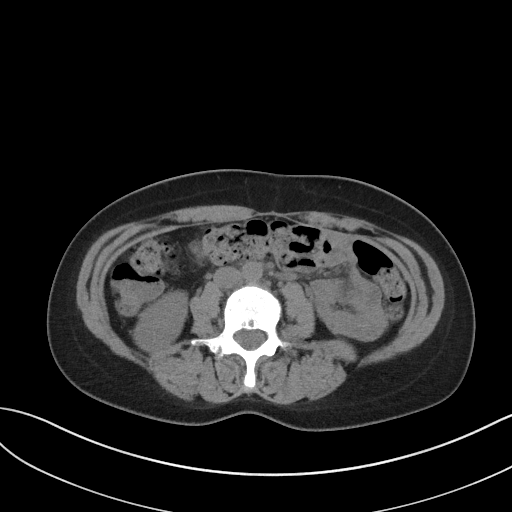
[im 53/86  bone]
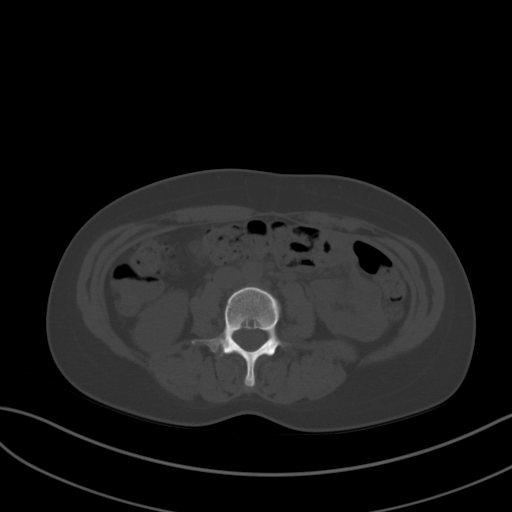
[im 56/86  soft-tissue]
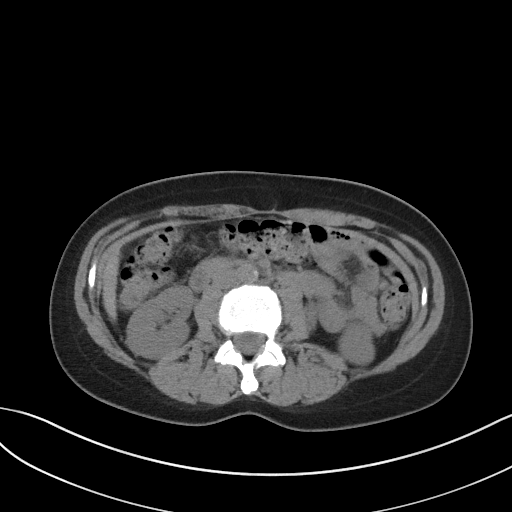
[im 63/86  soft-tissue]
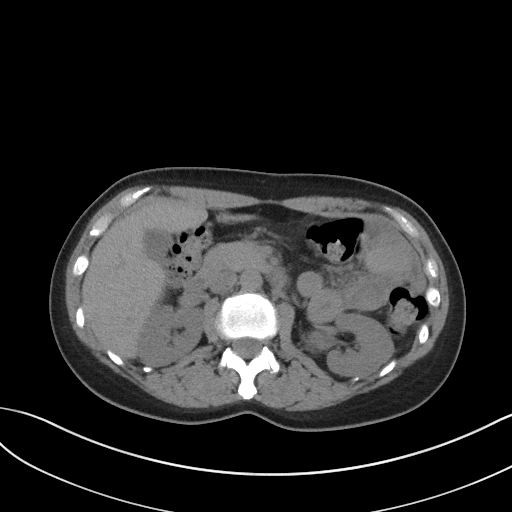
[im 69/86  soft-tissue]
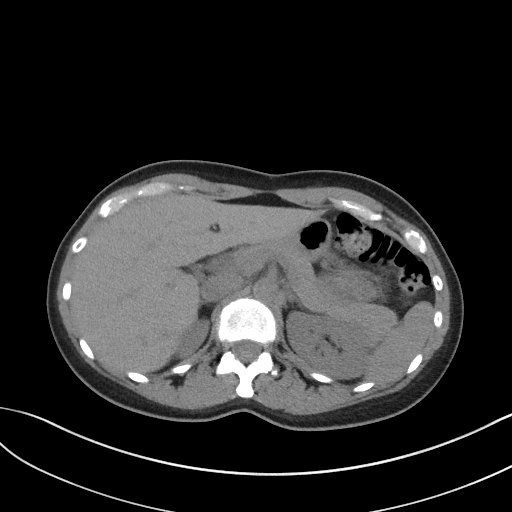
[im 76/86  soft-tissue]
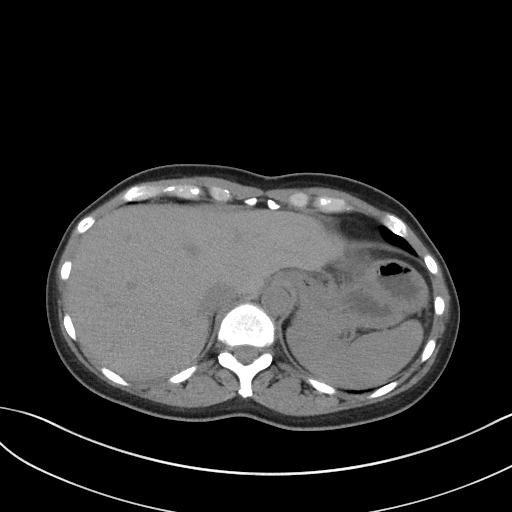
[im 82/86  soft-tissue]
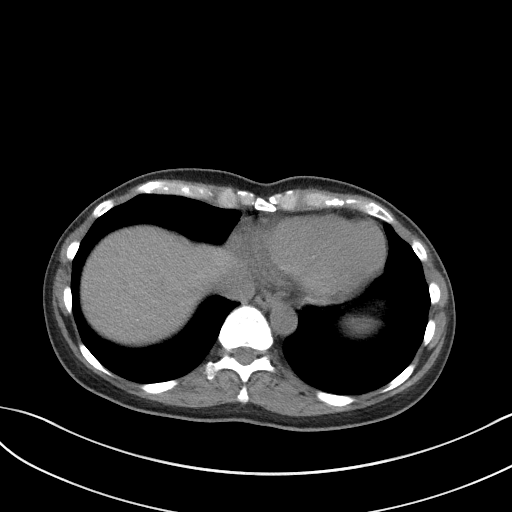

[Series 5: coronal · coronal · 0.81mm/px · 3 of 97 slices shown]
[im 33/97  soft-tissue]
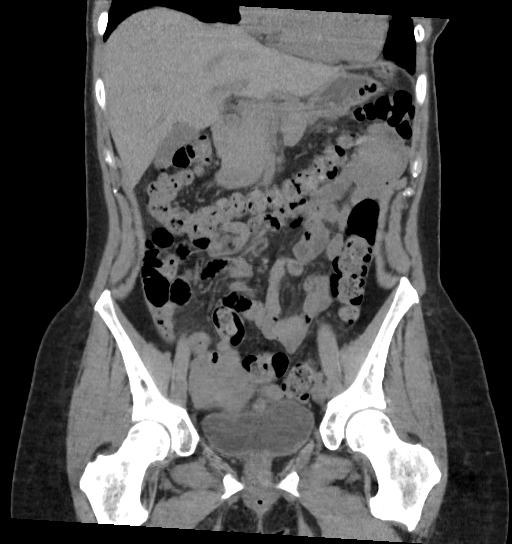
[im 43/97  soft-tissue]
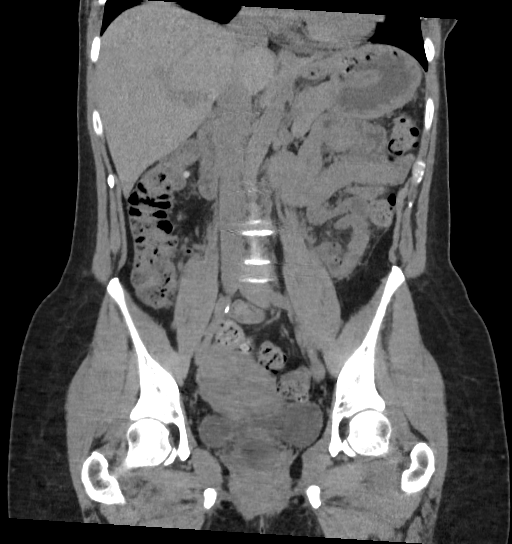
[im 54/97  soft-tissue]
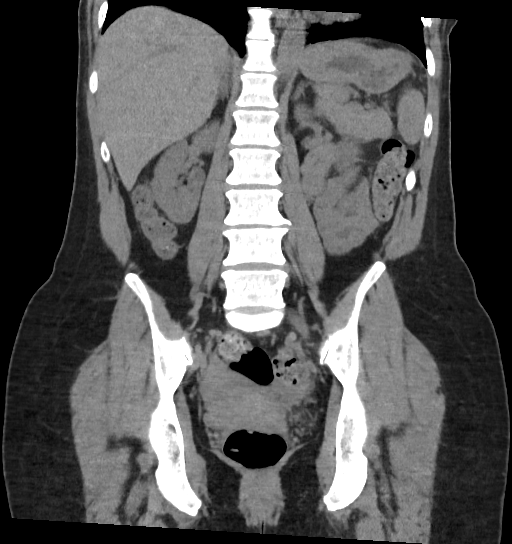

[17 of 46 positions shown; findings below may reference images not displayed]

FINDINGS: Lower chest: No acute abnormality.

Hepatobiliary: No focal liver abnormality is seen. No gallstones,
gallbladder wall thickening, or biliary dilatation.

Pancreas: Unremarkable. No pancreatic ductal dilatation or
surrounding inflammatory changes.

Spleen: Normal in size without focal abnormality.

Adrenals/Urinary Tract: Adrenal glands are within normal limits.
Kidneys are well visualized bilaterally. No renal calculi or
obstructive changes are noted. Bladder is partially distended.

Stomach/Bowel: Appendix is well visualized and within normal limits.
No obstructive or inflammatory changes of the colon are seen. The
small bowel and stomach are unremarkable.

Vascular/Lymphatic: Aortic atherosclerosis. No enlarged abdominal or
pelvic lymph nodes.

Reproductive: Uterus is somewhat bulky which may be related
underlying fibroid disease. No adnexal mass is seen.

Other: No abdominal wall hernia or abnormality. No abdominopelvic
ascites.

Musculoskeletal: No acute or significant osseous findings.
IMPRESSION: No evidence of renal calculi or urinary tract obstructive changes.

Normal-appearing appendix.

Changes of uterine fibroids which may contribute to the patient's
discomfort.

## 2022-01-26 ENCOUNTER — Telehealth (INDEPENDENT_AMBULATORY_CARE_PROVIDER_SITE_OTHER): Payer: Managed Care, Other (non HMO) | Admitting: Nurse Practitioner

## 2022-01-26 ENCOUNTER — Encounter: Payer: Self-pay | Admitting: Nurse Practitioner

## 2022-01-26 DIAGNOSIS — J0110 Acute frontal sinusitis, unspecified: Secondary | ICD-10-CM

## 2022-01-26 MED ORDER — METHYLPREDNISOLONE 4 MG PO TBPK: ORAL_TABLET | ORAL | 0 refills | Status: DC

## 2022-01-26 MED ORDER — HYDROCOD POLI-CHLORPHE POLI ER 10-8 MG/5ML PO SUER: 5.0000 mL | Freq: Every evening | ORAL | 0 refills | Status: DC | PRN

## 2022-01-26 MED ORDER — BENZONATATE 200 MG PO CAPS: 200.0000 mg | ORAL_CAPSULE | Freq: Two times a day (BID) | ORAL | 0 refills | Status: DC | PRN

## 2022-01-26 MED ORDER — AMOXICILLIN 500 MG PO CAPS: 500.0000 mg | ORAL_CAPSULE | Freq: Two times a day (BID) | ORAL | 0 refills | Status: AC

## 2022-01-26 NOTE — Progress Notes (Signed)
LMP 01/31/2017 (Approximate)    Subjective:    Patient ID: Krista Meyer, female    DOB: [birth year], 51 y.o.   MRN: [id number]  HPI: Krista Meyer is a 51 y.o. female  Chief Complaint  Patient presents with   Sinus Problem    Sinus congestion, fatigue, dry cough, occasional yellow/green tinted mucous. Denies fever, bodyaches.   UPPER RESPIRATORY TRACT INFECTION Worst symptom: Symptoms started about 1 week ago.  Patient states she feels like her symptoms are coming back.  She was really sick about 2-2.5 weeks ago and feels like those symptoms are returning.  Her COVID test was negative.  Fever: no Cough: yes Shortness of breath: no Wheezing: no Chest pain: no Chest tightness: no Chest congestion: no Nasal congestion: no Runny nose: no Post nasal drip: no Sneezing: no Sore throat: yes Swollen glands: no Sinus pressure: yes Headache: yes Face pain: yes Toothache: no Ear pain: no bilateral Ear pressure: no bilateral Eyes red/itching:no Eye drainage/crusting: no  Vomiting: no Rash: no Fatigue: yes Sick contacts: no Strep contacts: no  Context: worse Recurrent sinusitis: no Relief with OTC cold/cough medications: no  Treatments attempted: cough syrup   Relevant past medical, surgical, family and social history reviewed and updated as indicated. Interim medical history since our last visit reviewed. Allergies and medications reviewed and updated.  Review of Systems  Constitutional:  Positive for fatigue. Negative for fever.  HENT:  Positive for congestion, sinus pressure, sinus pain and sore throat. Negative for dental problem, ear pain, postnasal drip, rhinorrhea and sneezing.   Respiratory:  Positive for cough. Negative for shortness of breath and wheezing.   Cardiovascular:  Negative for chest pain.  Gastrointestinal:  Negative for vomiting.  Skin:  Negative for rash.  Neurological:  Positive for headaches.    Per HPI unless specifically  indicated above     Objective:    LMP 01/31/2017 (Approximate)   Wt Readings from Last 3 Encounters:  10/26/21 142 lb 3.2 oz (64.5 kg)  08/02/21 132 lb 12.8 oz (60.2 kg)  07/19/21 134 lb 12.8 oz (61.1 kg)    Physical Exam Vitals and nursing note reviewed.  HENT:     Head: Normocephalic.     Right Ear: Hearing normal.     Left Ear: Hearing normal.     Nose: Nose normal.  Eyes:     Pupils: Pupils are equal, round, and reactive to light.  Pulmonary:     Effort: Pulmonary effort is normal. No respiratory distress.  Neurological:     Mental Status: She is alert.  Psychiatric:        Mood and Affect: Mood normal.        Behavior: Behavior normal.        Thought Content: Thought content normal.        Judgment: Judgment normal.     Results for orders placed or performed in visit on 10/26/21  Urine Culture   Specimen: Urine   UR  Result Value Ref Range   Urine Culture, Routine Final report (A)    Organism ID, Bacteria Comment (A)    ORGANISM ID, BACTERIA Comment   Microscopic Examination   Urine  Result Value Ref Range   WBC, UA 0-5 0 - 5 /hpf   RBC, Urine None seen 0 - 2 /hpf   Epithelial Cells (non renal) 0-10 0 - 10 /hpf   Bacteria, UA None seen None seen/Few  Urinalysis, Routine w reflex microscopic  Result Value Ref  Range   Specific Gravity, UA >1.030 (H) 1.005 - 1.030   pH, UA 6.0 5.0 - 7.5   Color, UA Yellow Yellow   Appearance Ur Clear Clear   Leukocytes,UA Trace (A) Negative   Protein,UA Trace (A) Negative/Trace   Glucose, UA Negative Negative   Ketones, UA Negative Negative   RBC, UA Negative Negative   Bilirubin, UA Negative Negative   Urobilinogen, Ur 0.2 0.2 - 1.0 mg/dL   Nitrite, UA Negative Negative   Microscopic Examination See below:       Assessment & Plan:   Problem List Items Addressed This Visit   None Visit Diagnoses     Acute non-recurrent frontal sinusitis    -  Primary   Will treat with amoxicillin and prednisone. Will give cough  medicine due drainage caushing cough.  FU if symptoms not improved. Compelte course of medications.   Relevant Medications   benzonatate (TESSALON) 200 MG capsule   chlorpheniramine-HYDROcodone (TUSSIONEX) 10-8 MG/5ML   methylPREDNISolone (MEDROL DOSEPAK) 4 MG TBPK tablet   amoxicillin (AMOXIL) 500 MG capsule        Follow up plan: No follow-ups on file.   This visit was completed via MyChart due to the restrictions of the COVID-19 pandemic. All issues as above were discussed and addressed. Physical exam was done as above through visual confirmation on MyChart. If it was felt that the patient should be evaluated in the office, they were directed there. The patient verbally consented to this visit. Location of the patient: Work Location of the provider: Office Those involved with this call:  Provider: Jon Billings, NP CMA: Valinda Hoar, [place] Desk/Registration: Lynnell Catalan This encounter was conducted via video.  I spent 20 dedicated to the care of this patient on the date of this encounter to include previsit review of symptoms, plan of care and follow up, face to face time with the patient, and post visit ordering of testing.

## 2022-05-16 ENCOUNTER — Ambulatory Visit: Payer: Self-pay

## 2022-05-16 NOTE — Telephone Encounter (Addendum)
Called number left and was pt's mother. Called hone number, VM full. Called a different cell phone number and spoke to pt's husband. Pt had sz after she arrived to [place] and was taken to a local ED. She was dx with influenza A. Pt's husband stated the ED forgot to prescribe Tamiflu. Pt's husband seeking assistance with pt obtaining Tamiflu. Teams messaged [facility] and was advised by Alexisthe pt would need to call back the ED to get that prescription. Called pt's cell number and her mother, Krista Meyer answered. Was advised pt was asleep. Informed her mother that we would not be able to prescribe the Tamiflu. Advised her mother that pt will need to call the ED where she was treated and ask for the prescription. Advised pt's mother to call front desk of the hotel they are staying to get help locating and being swabbed for the Flu so that she can be started on Tamiflu as well. Pt's mother expressed appreciation and stated she will give pt the message.

## 2022-05-16 NOTE — Telephone Encounter (Signed)
Message from Sharene Skeans sent at 05/16/2022  9:11 AM EST  Summary: flu syptoms   Pts husband called and pt is out of town / she has influenza A and he stated she feels pretty bad and now her 51 yr old mom is getting sick / please advise Pt went to the ER on Sunday in [place] where she currently is        Pt sz on plane up to [place] has influenza A. Pt and mother in local hotel. The ED doc increased sz med.  Reason for Disposition . [1] Caller requesting NON-URGENT health information AND [2] PCP's office is the best resource  Answer Assessment - Initial Assessment Questions 1. REASON FOR CALL or QUESTION: "What is your reason for calling today?" or "How can I best help you?" or "What question do you have that I can help answer?"     Message from Sharene Skeans sent at 05/16/2022  9:11 AM EST    Summary: flu syptoms     Pts husband called and pt is out of town / she has influenza A and he stated she feels pretty bad and now her 7 yr old mom is getting sick / please advise Pt went to the ER on Sunday in [place] where she currently is    Pt sz on plane up to [place] has influenza A. Pt and mother in local hotel. The ED doc increased sz med.  Protocols used: Information Only Call - No Triage-A-AH

## 2022-07-04 ENCOUNTER — Encounter: Payer: Self-pay | Admitting: Nurse Practitioner

## 2022-07-04 ENCOUNTER — Ambulatory Visit (INDEPENDENT_AMBULATORY_CARE_PROVIDER_SITE_OTHER): Payer: Managed Care, Other (non HMO) | Admitting: Nurse Practitioner

## 2022-07-04 VITALS — BP 102/71 | HR 72 | Temp 98.8°F | Wt 148.6 lb

## 2022-07-04 DIAGNOSIS — K5900 Constipation, unspecified: Secondary | ICD-10-CM

## 2022-07-04 NOTE — Assessment & Plan Note (Signed)
Chronic, secondary to medications.  Recommend using miralax PRN and continue with stool softener.  Increase fiber and water intake.  Follow up if not improved.

## 2022-07-04 NOTE — Progress Notes (Signed)
BP 102/71   Pulse 72   Temp 98.8 F (37.1 C) (Oral)   Wt 148 lb 9.6 oz (67.4 kg)   LMP 01/31/2017 (Approximate)   SpO2 98%   BMI 26.73 kg/m    Subjective:    Patient ID: Krista Meyer, female    DOB: 10-Aug-1970, 52 y.o.   MRN: [id number]  HPI: Krista Meyer is a 52 y.o. female  Chief Complaint  Patient presents with   Hemorrhoids   Patient states her neurologist increased her medications for Epilepsy and since then she is having worse constipation.  Stool often last night and one this morning and has had minimal bowel movement.  She has not taken any miralax.  States she has a hemorrhoid on the outside but it has decreased in size.  She is using preparation H and the Suppositories.     Relevant past medical, surgical, family and social history reviewed and updated as indicated. Interim medical history since our last visit reviewed. Allergies and medications reviewed and updated.  Review of Systems  Gastrointestinal:  Positive for constipation.    Per HPI unless specifically indicated above     Objective:    BP 102/71   Pulse 72   Temp 98.8 F (37.1 C) (Oral)   Wt 148 lb 9.6 oz (67.4 kg)   LMP 01/31/2017 (Approximate)   SpO2 98%   BMI 26.73 kg/m   Wt Readings from Last 3 Encounters:  07/04/22 148 lb 9.6 oz (67.4 kg)  10/26/21 142 lb 3.2 oz (64.5 kg)  08/02/21 132 lb 12.8 oz (60.2 kg)    Physical Exam Vitals and nursing note reviewed.  Constitutional:      General: She is not in acute distress.    Appearance: Normal appearance. She is normal weight. She is not ill-appearing, toxic-appearing or diaphoretic.  HENT:     Head: Normocephalic.     Right Ear: External ear normal.     Left Ear: External ear normal.     Nose: Nose normal.     Mouth/Throat:     Mouth: Mucous membranes are moist.     Pharynx: Oropharynx is clear.  Eyes:     General:        Right eye: No discharge.        Left eye: No discharge.     Extraocular Movements:  Extraocular movements intact.     Conjunctiva/sclera: Conjunctivae normal.     Pupils: Pupils are equal, round, and reactive to light.  Cardiovascular:     Rate and Rhythm: Normal rate and regular rhythm.     Heart sounds: No murmur heard. Pulmonary:     Effort: Pulmonary effort is normal. No respiratory distress.     Breath sounds: Normal breath sounds. No wheezing or rales.  Musculoskeletal:     Cervical back: Normal range of motion and neck supple.  Skin:    General: Skin is warm and dry.     Capillary Refill: Capillary refill takes less than 2 seconds.  Neurological:     General: No focal deficit present.     Mental Status: She is alert and oriented to person, place, and time. Mental status is at baseline.  Psychiatric:        Mood and Affect: Mood normal.        Behavior: Behavior normal.        Thought Content: Thought content normal.        Judgment: Judgment normal.     Results  for orders placed or performed in visit on 10/26/21  Urine Culture   Specimen: Urine   UR  Result Value Ref Range   Urine Culture, Routine Final report (A)    Organism ID, Bacteria Comment (A)    ORGANISM ID, BACTERIA Comment   Microscopic Examination   Urine  Result Value Ref Range   WBC, UA 0-5 0 - 5 /hpf   RBC, Urine None seen 0 - 2 /hpf   Epithelial Cells (non renal) 0-10 0 - 10 /hpf   Bacteria, UA None seen None seen/Few  Urinalysis, Routine w reflex microscopic  Result Value Ref Range   Specific Gravity, UA >1.030 (H) 1.005 - 1.030   pH, UA 6.0 5.0 - 7.5   Color, UA Yellow Yellow   Appearance Ur Clear Clear   Leukocytes,UA Trace (A) Negative   Protein,UA Trace (A) Negative/Trace   Glucose, UA Negative Negative   Ketones, UA Negative Negative   RBC, UA Negative Negative   Bilirubin, UA Negative Negative   Urobilinogen, Ur 0.2 0.2 - 1.0 mg/dL   Nitrite, UA Negative Negative   Microscopic Examination See below:       Assessment & Plan:   Problem List Items Addressed This  Visit       Other   Constipation - Primary    Chronic, secondary to medications.  Recommend using miralax PRN and continue with stool softener.  Increase fiber and water intake.  Follow up if not improved.        Follow up plan: Return in about 2 months (around 09/02/2022) for Physical and Fasting labs.

## 2022-09-05 ENCOUNTER — Ambulatory Visit (INDEPENDENT_AMBULATORY_CARE_PROVIDER_SITE_OTHER): Payer: Managed Care, Other (non HMO) | Admitting: Nurse Practitioner

## 2022-09-05 ENCOUNTER — Encounter: Payer: Self-pay | Admitting: Nurse Practitioner

## 2022-09-05 VITALS — BP 105/66 | HR 76 | Temp 97.9°F | Ht 63.5 in | Wt 147.2 lb

## 2022-09-05 DIAGNOSIS — Z23 Encounter for immunization: Secondary | ICD-10-CM | POA: Diagnosis not present

## 2022-09-05 DIAGNOSIS — Z1211 Encounter for screening for malignant neoplasm of colon: Secondary | ICD-10-CM

## 2022-09-05 DIAGNOSIS — Z0000 Encounter for general adult medical examination without abnormal findings: Secondary | ICD-10-CM

## 2022-09-05 DIAGNOSIS — Z1159 Encounter for screening for other viral diseases: Secondary | ICD-10-CM | POA: Diagnosis not present

## 2022-09-05 DIAGNOSIS — Z114 Encounter for screening for human immunodeficiency virus [HIV]: Secondary | ICD-10-CM | POA: Diagnosis not present

## 2022-09-05 DIAGNOSIS — G40109 Localization-related (focal) (partial) symptomatic epilepsy and epileptic syndromes with simple partial seizures, not intractable, without status epilepticus: Secondary | ICD-10-CM | POA: Diagnosis not present

## 2022-09-05 DIAGNOSIS — Z136 Encounter for screening for cardiovascular disorders: Secondary | ICD-10-CM

## 2022-09-05 LAB — URINALYSIS, ROUTINE W REFLEX MICROSCOPIC
Bilirubin, UA: NEGATIVE
Glucose, UA: NEGATIVE
Ketones, UA: NEGATIVE
Leukocytes,UA: NEGATIVE
Nitrite, UA: NEGATIVE
Protein,UA: NEGATIVE
RBC, UA: NEGATIVE
Specific Gravity, UA: 1.025 (ref 1.005–1.030)
Urobilinogen, Ur: 0.2 mg/dL (ref 0.2–1.0)
pH, UA: 6 (ref 5.0–7.5)

## 2022-09-05 NOTE — Progress Notes (Signed)
BP 105/66   Pulse 76   Temp 97.9 F (36.6 C) (Oral)   Ht 5' 3.5" (1.613 m)   Wt 147 lb 3.2 oz (66.8 kg)   LMP 01/31/2017 (Approximate)   SpO2 98%   BMI 25.67 kg/m    Subjective:    Patient ID: Krista Meyer, female    DOB: 01-23-1971, 52 y.o.   MRN: [id number]  HPI: Krista Meyer is a 52 y.o. female presenting on 09/05/2022 for comprehensive medical examination. Current medical complaints include:none  She currently lives with: Menopausal Symptoms: no   Denies HA, CP, SOB, dizziness, palpitations, visual changes, and lower extremity swelling.   Depression Screen done today and results listed below:     09/05/2022    2:33 PM 07/04/2022    2:23 PM 10/26/2021    3:35 PM 08/02/2021    4:23 PM 12/17/2020    8:58 AM  Depression screen PHQ 2/9  Decreased Interest 0 0 0 0 0  Down, Depressed, Hopeless 0 0 1 0 0  PHQ - 2 Score 0 0 1 0 0  Altered sleeping 1 2 1 1 1   Tired, decreased energy 1 0 1 1 1   Change in appetite 2 1 1  0 0  Feeling bad or failure about yourself  0 0 1 0 0  Trouble concentrating 3 1 1 3  0  Moving slowly or fidgety/restless 0 0 0 0 0  Suicidal thoughts 0 0 0 0 0  PHQ-9 Score 7 4 6 5 2   Difficult doing work/chores Somewhat difficult Not difficult at all Somewhat difficult Not difficult at all Not difficult at all    The patient does not have a history of falls. I did complete a risk assessment for falls. A plan of care for falls was documented.   Past Medical History:  Past Medical History:  Diagnosis Date   Abnormal uterine bleeding (AUB)    ADHD    Allergy 1994   Anxiety since 1994   Arthritis 2003   History of exercise stress test 02-17-2009 by dr Eldridge Dace   non-diagnostic given baseline ECG, abnormality. No sx. Doubt ischemic   Intermittent palpitations    Localization-related focal epilepsy with complex partial seizures dx age 50--- neurologist-- dr Everlena Cooper ([place])   per lov note -- stable/  per pt last seizure age 44, controlled w/  medication   Major depression    Mild mitral valve prolapse    Pulmonary nodules 07/31/2016   Restless leg syndrome     Surgical History:  Past Surgical History:  Procedure Laterality Date   BREAST SURGERY  breast lump removed and biopsied   DILITATION & CURRETTAGE/HYSTROSCOPY WITH NOVASURE ABLATION N/A 03/30/2017   Procedure: DILATATION & CURETTAGE/HYSTEROSCOPY WITH NOVASURE ABLATION;  Surgeon: Ranae Pila, MD;  Location: [facility] ;  Service: Gynecology;  Laterality: N/A;   NASAL SEPTUM SURGERY  TEEN   PARTIAL MASTECTOMY WITH NEEDLE LOCALIZATION Right 07-23-2007   dr Abbey Chatters  [facility]   benign   TRANSTHORACIC ECHOCARDIOGRAM  02-17-2009   dr Eldridge Dace   ef 65-70%/  mild MR and mild MVP/  trivial TR/  mild PI    Medications:  Current Outpatient Medications on File Prior to Visit  Medication Sig   acetaminophen (TYLENOL) 325 MG tablet Take 325 mg by mouth every 6 (six) hours as needed for pain (prn).   Desvenlafaxine Succinate ER 25 MG TB24 Take 1 tablet by mouth daily.   docusate sodium (COLACE) 100 MG capsule Take  1 capsule (100 mg total) by mouth 2 (two) times daily.   melatonin 3 MG TABS tablet Take 3 mg by mouth at bedtime.   oxcarbazepine (TRILEPTAL) 600 MG tablet Take 600 mg by mouth 2 (two) times daily.   No current facility-administered medications on file prior to visit.    Allergies:  Allergies  Allergen Reactions   Betadine [Povidone Iodine] Other (See Comments)    "burns skin" or anything that had iodine in it   Ivp Dye [Iodinated Contrast Media] Other (See Comments)    Per pt does ok when pre-medicated   Shellfish Allergy Nausea And Vomiting   Lamotrigine Rash    Social History:  Social History   Socioeconomic History   Marital status: Married    Spouse name: Not on file   Number of children: Not on file   Years of education: Not on file   Highest education level: Not on file  Occupational History   Not on file  Tobacco Use    Smoking status: Never   Smokeless tobacco: Never  Vaping Use   Vaping Use: Never used  Substance and Sexual Activity   Alcohol use: No   Drug use: No   Sexual activity: Not Currently    Birth control/protection: None    Comment: Had a DNC about 4 years ago  Other Topics Concern   Not on file  Social History Narrative   Not on file   Social Determinants of Health   Financial Resource Strain: Not on file  Food Insecurity: Not on file  Transportation Needs: Not on file  Physical Activity: Not on file  Stress: Not on file  Social Connections: Not on file  Intimate Partner Violence: Not on file   Social History   Tobacco Use  Smoking Status Never  Smokeless Tobacco Never   Social History   Substance and Sexual Activity  Alcohol Use No    Family History:  Family History  Problem Relation Age of Onset   Diabetes insipidus Paternal Uncle    Stroke Paternal Grandfather    Alcohol abuse Paternal Grandfather    Diabetes insipidus Maternal Aunt    Diabetes Maternal Aunt    Arthritis Mother    COPD Mother    Cancer Father    Alcohol abuse Brother     Past medical history, surgical history, medications, allergies, family history and social history reviewed with patient today and changes made to appropriate areas of the chart.   Review of Systems  Eyes:  Negative for blurred vision and double vision.  Respiratory:  Negative for shortness of breath.   Cardiovascular:  Negative for chest pain, palpitations and leg swelling.  Neurological:  Negative for dizziness and headaches.   All other ROS negative except what is listed above and in the HPI.      Objective:    BP 105/66   Pulse 76   Temp 97.9 F (36.6 C) (Oral)   Ht 5' 3.5" (1.613 m)   Wt 147 lb 3.2 oz (66.8 kg)   LMP 01/31/2017 (Approximate)   SpO2 98%   BMI 25.67 kg/m   Wt Readings from Last 3 Encounters:  09/05/22 147 lb 3.2 oz (66.8 kg)  07/04/22 148 lb 9.6 oz (67.4 kg)  10/26/21 142 lb 3.2 oz (64.5  kg)    Physical Exam Vitals and nursing note reviewed.  Constitutional:      General: She is awake. She is not in acute distress.    Appearance: Normal appearance.  She is well-developed. She is not ill-appearing.  HENT:     Head: Normocephalic and atraumatic.     Right Ear: Hearing, tympanic membrane, ear canal and external ear normal. No drainage.     Left Ear: Hearing, tympanic membrane, ear canal and external ear normal. No drainage.     Nose: Nose normal.     Right Sinus: No maxillary sinus tenderness or frontal sinus tenderness.     Left Sinus: No maxillary sinus tenderness or frontal sinus tenderness.     Mouth/Throat:     Mouth: Mucous membranes are moist.     Pharynx: Oropharynx is clear. Uvula midline. No pharyngeal swelling, oropharyngeal exudate or posterior oropharyngeal erythema.  Eyes:     General: Lids are normal.        Right eye: No discharge.        Left eye: No discharge.     Extraocular Movements: Extraocular movements intact.     Conjunctiva/sclera: Conjunctivae normal.     Pupils: Pupils are equal, round, and reactive to light.     Visual Fields: Right eye visual fields normal and left eye visual fields normal.  Neck:     Thyroid: No thyromegaly.     Vascular: No carotid bruit.     Trachea: Trachea normal.  Cardiovascular:     Rate and Rhythm: Normal rate and regular rhythm.     Heart sounds: Normal heart sounds. No murmur heard.    No gallop.  Pulmonary:     Effort: Pulmonary effort is normal. No accessory muscle usage or respiratory distress.     Breath sounds: Normal breath sounds.  Chest:  Breasts:    Right: Normal.     Left: Normal.  Abdominal:     General: Bowel sounds are normal.     Palpations: Abdomen is soft. There is no hepatomegaly or splenomegaly.     Tenderness: There is no abdominal tenderness.  Musculoskeletal:        General: Normal range of motion.     Cervical back: Normal range of motion and neck supple.     Right lower leg: No  edema.     Left lower leg: No edema.  Lymphadenopathy:     Head:     Right side of head: No submental, submandibular, tonsillar, preauricular or posterior auricular adenopathy.     Left side of head: No submental, submandibular, tonsillar, preauricular or posterior auricular adenopathy.     Cervical: No cervical adenopathy.     Upper Body:     Right upper body: No supraclavicular, axillary or pectoral adenopathy.     Left upper body: No supraclavicular, axillary or pectoral adenopathy.  Skin:    General: Skin is warm and dry.     Capillary Refill: Capillary refill takes less than 2 seconds.     Findings: No rash.  Neurological:     Mental Status: She is alert and oriented to person, place, and time.     Gait: Gait is intact.  Psychiatric:        Attention and Perception: Attention normal.        Mood and Affect: Mood normal.        Speech: Speech normal.        Behavior: Behavior normal. Behavior is cooperative.        Thought Content: Thought content normal.        Judgment: Judgment normal.     Results for orders placed or performed in visit on 10/26/21  Urine  Culture   Specimen: Urine   UR  Result Value Ref Range   Urine Culture, Routine Final report (A)    Organism ID, Bacteria Comment (A)    ORGANISM ID, BACTERIA Comment   Microscopic Examination   Urine  Result Value Ref Range   WBC, UA 0-5 0 - 5 /hpf   RBC, Urine None seen 0 - 2 /hpf   Epithelial Cells (non renal) 0-10 0 - 10 /hpf   Bacteria, UA None seen None seen/Few  Urinalysis, Routine w reflex microscopic  Result Value Ref Range   Specific Gravity, UA >1.030 (H) 1.005 - 1.030   pH, UA 6.0 5.0 - 7.5   Color, UA Yellow Yellow   Appearance Ur Clear Clear   Leukocytes,UA Trace (A) Negative   Protein,UA Trace (A) Negative/Trace   Glucose, UA Negative Negative   Ketones, UA Negative Negative   RBC, UA Negative Negative   Bilirubin, UA Negative Negative   Urobilinogen, Ur 0.2 0.2 - 1.0 mg/dL   Nitrite, UA  Negative Negative   Microscopic Examination See below:       Assessment & Plan:   Problem List Items Addressed This Visit       Nervous and Auditory   Localization-related epilepsy    Chronic.  Controlled.  Continue with current medication regimen on Trileptal.  Labs ordered today.  Return to clinic in 6 months for reevaluation.  Call sooner if concerns arise.        Relevant Orders   10-Hydroxycarbazepine   Other Visit Diagnoses     Annual physical exam    -  Primary   Health maintenance reviewed during visit today.  Labs ordered.  TDAP given. WIll come back for shingles. Colonoscopy ordered. Mammogram and PAP at GYN.   Relevant Orders   CBC with Differential/Platelet   Comprehensive metabolic panel   TSH   Urinalysis, Routine w reflex microscopic   HIV Antibody (routine testing w rflx)   Hepatitis C Antibody   Screening for HIV (human immunodeficiency virus)       Relevant Orders   HIV Antibody (routine testing w rflx)   Encounter for hepatitis C screening test for low risk patient       Relevant Orders   Hepatitis C Antibody   Screening for ischemic heart disease       Relevant Orders   Lipid panel   Screening for colon cancer       Relevant Orders   Ambulatory referral to Gastroenterology   Need for tetanus booster       Relevant Orders   Tdap vaccine greater than or equal to 7yo IM        Follow up plan: Return in about 6 months (around 03/07/2023) for HTN, HLD, DM2 FU.   LABORATORY TESTING:  - Pap smear: done elsewhere  IMMUNIZATIONS:   - Tdap: Tetanus vaccination status reviewed: last tetanus booster within 10 years. - Influenza: Postponed to flu season - Pneumovax: Not applicable - Prevnar: Not applicable - COVID: Not applicable - HPV: Not applicable - Shingrix vaccine:  Discussed at visit today  SCREENING: -Mammogram:  Will schedule with GYN   - Colonoscopy: Ordered today  - Bone Density: Not applicable  -Hearing Test: Not applicable   -Spirometry: Not applicable   PATIENT COUNSELING:   Advised to take 1 mg of folate supplement per day if capable of pregnancy.   Sexuality: Discussed sexually transmitted diseases, partner selection, use of condoms, avoidance of unintended pregnancy  and  contraceptive alternatives.   Advised to avoid cigarette smoking.  I discussed with the patient that most people either abstain from alcohol or drink within safe limits (<=14/week and <=4 drinks/occasion for males, <=7/weeks and <= 3 drinks/occasion for females) and that the risk for alcohol disorders and other health effects rises proportionally with the number of drinks per week and how often a drinker exceeds daily limits.  Discussed cessation/primary prevention of drug use and availability of treatment for abuse.   Diet: Encouraged to adjust caloric intake to maintain  or achieve ideal body weight, to reduce intake of dietary saturated fat and total fat, to limit sodium intake by avoiding high sodium foods and not adding table salt, and to maintain adequate dietary potassium and calcium preferably from fresh fruits, vegetables, and low-fat dairy products.    stressed the importance of regular exercise  Injury prevention: Discussed safety belts, safety helmets, smoke detector, smoking near bedding or upholstery.   Dental health: Discussed importance of regular tooth brushing, flossing, and dental visits.    NEXT PREVENTATIVE PHYSICAL DUE IN 1 YEAR. Return in about 6 months (around 03/07/2023) for HTN, HLD, DM2 FU.

## 2022-09-05 NOTE — Assessment & Plan Note (Signed)
Chronic.  Controlled.  Continue with current medication regimen on Trileptal.  Labs ordered today.  Return to clinic in 6 months for reevaluation.  Call sooner if concerns arise.

## 2022-09-06 LAB — 10-HYDROXYCARBAZEPINE

## 2022-09-06 LAB — HEPATITIS C ANTIBODY: Hep C Virus Ab: NONREACTIVE

## 2022-09-06 NOTE — Progress Notes (Signed)
HI Krista Meyer. It was nice to see you yesterday.  Your lab work looks good.  Your cholesterol is elevated.  I recommend following a low fat diet and exercise.  No concerns at this time. Continue with your current medication regimen.  Follow up as discussed.  Please let me know if you have any questions.

## 2022-09-08 LAB — CBC WITH DIFFERENTIAL/PLATELET
Basophils Absolute: 0.1 10*3/uL (ref 0.0–0.2)
Basos: 1 %
EOS (ABSOLUTE): 0.2 10*3/uL (ref 0.0–0.4)
Eos: 3 %
Hematocrit: 38 % (ref 34.0–46.6)
Hemoglobin: 13.2 g/dL (ref 11.1–15.9)
Immature Grans (Abs): 0 10*3/uL (ref 0.0–0.1)
Immature Granulocytes: 0 %
Lymphocytes Absolute: 1.8 10*3/uL (ref 0.7–3.1)
Lymphs: 39 %
MCH: 31 pg (ref 26.6–33.0)
MCHC: 34.7 g/dL (ref 31.5–35.7)
MCV: 89 fL (ref 79–97)
Monocytes Absolute: 0.3 10*3/uL (ref 0.1–0.9)
Monocytes: 7 %
Neutrophils Absolute: 2.3 10*3/uL (ref 1.4–7.0)
Neutrophils: 50 %
Platelets: 304 10*3/uL (ref 150–450)
RBC: 4.26 x10E6/uL (ref 3.77–5.28)
RDW: 11.6 % — ABNORMAL LOW (ref 11.7–15.4)
WBC: 4.7 10*3/uL (ref 3.4–10.8)

## 2022-09-08 LAB — COMPREHENSIVE METABOLIC PANEL
ALT: 13 IU/L (ref 0–32)
AST: 12 IU/L (ref 0–40)
Albumin/Globulin Ratio: 2.3 — ABNORMAL HIGH (ref 1.2–2.2)
Albumin: 4.8 g/dL (ref 3.8–4.9)
Alkaline Phosphatase: 87 IU/L (ref 44–121)
BUN/Creatinine Ratio: 12 (ref 9–23)
BUN: 7 mg/dL (ref 6–24)
Bilirubin Total: <0.2 mg/dL (ref 0.0–1.2)
CO2: 25 mmol/L (ref 20–29)
Calcium: 9.4 mg/dL (ref 8.7–10.2)
Chloride: 91 mmol/L — ABNORMAL LOW (ref 96–106)
Creatinine, Ser: 0.6 mg/dL (ref 0.57–1.00)
Globulin, Total: 2.1 g/dL (ref 1.5–4.5)
Glucose: 91 mg/dL (ref 70–99)
Potassium: 4.1 mmol/L (ref 3.5–5.2)
Sodium: 129 mmol/L — ABNORMAL LOW (ref 134–144)
Total Protein: 6.9 g/dL (ref 6.0–8.5)
eGFR: 109 mL/min/{1.73_m2} (ref >59)

## 2022-09-08 LAB — LIPID PANEL
Chol/HDL Ratio: 3.1 ratio (ref 0.0–4.4)
Cholesterol, Total: 248 mg/dL — ABNORMAL HIGH (ref 100–199)
HDL: 79 mg/dL (ref >39)
LDL Chol Calc (NIH): 145 mg/dL — ABNORMAL HIGH (ref 0–99)
Triglycerides: 138 mg/dL (ref 0–149)
VLDL Cholesterol Cal: 24 mg/dL (ref 5–40)

## 2022-09-08 LAB — HIV ANTIBODY (ROUTINE TESTING W REFLEX): HIV Screen 4th Generation wRfx: NONREACTIVE

## 2022-09-08 LAB — TSH: TSH: 0.972 u[IU]/mL (ref 0.450–4.500)

## 2022-09-13 ENCOUNTER — Encounter: Payer: Self-pay | Admitting: *Deleted

## 2023-05-08 ENCOUNTER — Ambulatory Visit: Payer: Managed Care, Other (non HMO) | Admitting: Pediatrics

## 2023-05-09 ENCOUNTER — Ambulatory Visit: Payer: Managed Care, Other (non HMO) | Admitting: Family Medicine

## 2023-05-18 ENCOUNTER — Ambulatory Visit: Payer: Managed Care, Other (non HMO) | Admitting: Pediatrics
